# Patient Record
Sex: Male | Born: 1960 | Race: White | Hispanic: No | Smoking: Never smoker
Health system: Southern US, Community
[De-identification: ages and names within clinical notes are randomized; demographics above are authoritative.]

## PROBLEM LIST (undated history)

## (undated) DIAGNOSIS — I2510 Atherosclerotic heart disease of native coronary artery without angina pectoris: Secondary | ICD-10-CM

## (undated) DIAGNOSIS — I809 Phlebitis and thrombophlebitis of unspecified site: Secondary | ICD-10-CM

## (undated) DIAGNOSIS — I2109 ST elevation (STEMI) myocardial infarction involving other coronary artery of anterior wall: Secondary | ICD-10-CM

## (undated) DIAGNOSIS — E7800 Pure hypercholesterolemia, unspecified: Secondary | ICD-10-CM

## (undated) DIAGNOSIS — T7840XA Allergy, unspecified, initial encounter: Secondary | ICD-10-CM

## (undated) DIAGNOSIS — I2699 Other pulmonary embolism without acute cor pulmonale: Secondary | ICD-10-CM

## (undated) HISTORY — DX: ST elevation (STEMI) myocardial infarction involving other coronary artery of anterior wall: I21.09

## (undated) HISTORY — DX: Phlebitis and thrombophlebitis of unspecified site: I80.9

## (undated) HISTORY — DX: Other pulmonary embolism without acute cor pulmonale: I26.99

## (undated) HISTORY — DX: Allergy, unspecified, initial encounter: T78.40XA

## (undated) HISTORY — DX: Atherosclerotic heart disease of native coronary artery without angina pectoris: I25.10

## (undated) HISTORY — DX: Pure hypercholesterolemia, unspecified: E78.00

---

## 2009-12-20 ENCOUNTER — Encounter: Payer: Self-pay | Admitting: Internal Medicine

## 2009-12-20 ENCOUNTER — Inpatient Hospital Stay (HOSPITAL_COMMUNITY): Admission: EM | Admit: 2009-12-20 | Discharge: 2009-12-21 | Payer: Self-pay | Admitting: Emergency Medicine

## 2009-12-20 ENCOUNTER — Encounter: Payer: Self-pay | Admitting: Family Medicine

## 2009-12-21 ENCOUNTER — Encounter (INDEPENDENT_AMBULATORY_CARE_PROVIDER_SITE_OTHER): Payer: Self-pay | Admitting: Internal Medicine

## 2009-12-21 ENCOUNTER — Ambulatory Visit: Payer: Self-pay | Admitting: Vascular Surgery

## 2009-12-21 ENCOUNTER — Ambulatory Visit: Payer: Self-pay | Admitting: Family Medicine

## 2009-12-23 ENCOUNTER — Encounter: Payer: Self-pay | Admitting: Internal Medicine

## 2010-01-09 DIAGNOSIS — Z86718 Personal history of other venous thrombosis and embolism: Secondary | ICD-10-CM

## 2010-01-09 DIAGNOSIS — Z87442 Personal history of urinary calculi: Secondary | ICD-10-CM

## 2010-01-10 ENCOUNTER — Ambulatory Visit: Payer: Self-pay | Admitting: Internal Medicine

## 2010-01-10 ENCOUNTER — Telehealth (INDEPENDENT_AMBULATORY_CARE_PROVIDER_SITE_OTHER): Payer: Self-pay | Admitting: *Deleted

## 2010-01-10 DIAGNOSIS — R042 Hemoptysis: Secondary | ICD-10-CM | POA: Insufficient documentation

## 2010-01-10 DIAGNOSIS — J90 Pleural effusion, not elsewhere classified: Secondary | ICD-10-CM | POA: Insufficient documentation

## 2010-01-10 LAB — CONVERTED CEMR LAB
Basophils Absolute: 0.1 10*3/uL (ref 0.0–0.1)
Eosinophils Absolute: 0.2 10*3/uL (ref 0.0–0.7)
Lymphs Abs: 2.4 10*3/uL (ref 0.7–4.0)
MCHC: 34.7 g/dL (ref 30.0–36.0)
MCV: 91.5 fL (ref 78.0–100.0)
Monocytes Absolute: 0.6 10*3/uL (ref 0.1–1.0)
Neutrophils Relative %: 67.8 % (ref 43.0–77.0)
Platelets: 319 10*3/uL (ref 150.0–400.0)
Prothrombin Time: 23.5 s — ABNORMAL HIGH (ref 9.7–11.8)
RDW: 12.6 % (ref 11.5–14.6)
Sed Rate: 39 mm/hr — ABNORMAL HIGH (ref 0–22)
WBC: 10.1 10*3/uL (ref 4.5–10.5)

## 2010-01-12 ENCOUNTER — Ambulatory Visit: Payer: Self-pay | Admitting: Cardiovascular Disease

## 2010-01-24 ENCOUNTER — Ambulatory Visit: Payer: Self-pay | Admitting: Cardiology

## 2010-02-06 ENCOUNTER — Ambulatory Visit: Payer: Self-pay | Admitting: Cardiovascular Disease

## 2010-02-12 ENCOUNTER — Telehealth (INDEPENDENT_AMBULATORY_CARE_PROVIDER_SITE_OTHER): Payer: Self-pay | Admitting: *Deleted

## 2010-02-27 ENCOUNTER — Ambulatory Visit: Payer: Self-pay | Admitting: Cardiology

## 2010-02-27 LAB — CONVERTED CEMR LAB: POC INR: 1.7

## 2010-03-13 ENCOUNTER — Ambulatory Visit: Payer: Self-pay | Admitting: Cardiology

## 2010-03-13 LAB — CONVERTED CEMR LAB: POC INR: 3.5

## 2010-03-29 ENCOUNTER — Ambulatory Visit: Payer: Self-pay | Admitting: Cardiology

## 2010-03-29 LAB — CONVERTED CEMR LAB: POC INR: 2.8

## 2010-04-03 ENCOUNTER — Ambulatory Visit: Payer: Self-pay | Admitting: Internal Medicine

## 2010-04-24 ENCOUNTER — Ambulatory Visit: Payer: Self-pay | Admitting: Cardiology

## 2010-05-25 ENCOUNTER — Ambulatory Visit: Payer: Self-pay | Admitting: Internal Medicine

## 2010-06-20 ENCOUNTER — Ambulatory Visit: Admission: RE | Admit: 2010-06-20 | Discharge: 2010-06-20 | Payer: Self-pay | Source: Home / Self Care

## 2010-06-20 LAB — CONVERTED CEMR LAB: POC INR: 1.8

## 2010-07-11 ENCOUNTER — Ambulatory Visit: Admission: RE | Admit: 2010-07-11 | Discharge: 2010-07-11 | Payer: Self-pay | Source: Home / Self Care

## 2010-07-17 NOTE — Medication Information (Signed)
Summary: rov/tp  Anticoagulant Therapy  Managed by: Geoffry Paradise, PharmD Referring MD: Sherene Sires MD, Casimiro Needle PCP: Dr. Emmit Alexanders MD: Tenny Craw MD, Gunnar Fusi Indication 1: Pulmonary Embolism Lab Used: [facility] [place] Site: [address] INR RANGE 2.0-3.0  Dietary changes: no    Health status changes: no    Bleeding/hemorrhagic complications: no    Recent/future hospitalizations: no    Any changes in medication regimen? no    Recent/future dental: no  Any missed doses?: no       Is patient compliant with meds? yes       Allergies: 1)  ! Pcn  Anticoagulation Management History:      Negative risk factors for bleeding include an age less than 49 years old.  The bleeding index is 'low risk'.  Negative CHADS2 values include Age > 49 years old.  His last INR was 2.2 ratio.  Anticoagulation responsible provider: Tenny Craw MD, Gunnar Fusi.  Cuvette Lot#: [id number].  Exp: 05/17/2012.    Anticoagulation Management Assessment/Plan:      The patient's current anticoagulation dose is Warfarin sodium 5 mg tabs: Take as directed by coumadin clinic..  The next INR is due 06/22/2010.  Anticoagulation instructions were given to patient.  Results were reviewed/authorized by Geoffry Paradise, PharmD.         Prior Anticoagulation Instructions: INR 2.3  Continue same dose of 1/2 tablet every day except 1 tablet on Wednesday and Saturday.  Recheck INR in 4 weeks.   Current Anticoagulation Instructions: INR: 2.2  Your INR is at goal today.  Please continue taking 5 mg (1 tablet) on Wednesdays and Saturdays, and 2.5 mg (1/2 tablet) other days othe week.  Please return in 4 weeks for another INR check.

## 2010-07-17 NOTE — Medication Information (Signed)
Summary: Andrew Gilbert  Anticoagulant Therapy  Managed by: Weston Brass, PharmD Referring MD: Sherene Sires MD, Casimiro Needle PCP: Dr. Emmit Alexanders MD: Myrtis Ser MD, Tinnie Gens Indication 1: Pulmonary Embolism Lab Used: [facility]  Site: [address] INR POC 3.5 INR RANGE 2.0-3.0  Dietary changes: yes       Details: ate less greens  Health status changes: no    Bleeding/hemorrhagic complications: no    Recent/future hospitalizations: no    Any changes in medication regimen? no    Recent/future dental: no  Any missed doses?: no       Is patient compliant with meds? yes       Allergies: 1)  ! Pcn  Anticoagulation Management History:      The patient is taking warfarin and comes in today for a routine follow up visit.  Negative risk factors for bleeding include an age less than 37 years old.  The bleeding index is 'low risk'.  Negative CHADS2 values include Age > 38 years old.  His last INR was 2.2 ratio.  Anticoagulation responsible provider: Myrtis Ser MD, Tinnie Gens.  INR POC: 3.5.  Cuvette Lot#: [id number].  Exp: 04/2011.    Anticoagulation Management Assessment/Plan:      The patient's current anticoagulation dose is Warfarin sodium 5 mg tabs: Take as directed by coumadin clinic..  The next INR is due 03/27/2010.  Anticoagulation instructions were given to patient.  Results were reviewed/authorized by Weston Brass, PharmD.  He was notified by Kennieth Francois.         Prior Anticoagulation Instructions: INR 1.7  Take 1 tablet today. Then take 1/2 tablet everyday except take 1 tablet on Mondays, Wednesdays, and Saturdays. Re-check INR in 2 weeks.   Current Anticoagulation Instructions: INR 3.5  Hold today's dose of Coumadin, then resume one-half tablet every day except for one tablet on Wednesday and Saturday.  Recheck in two weeks.

## 2010-07-17 NOTE — Medication Information (Signed)
Summary: rov/jk  Anticoagulant Therapy  Managed by: Weston Brass, PharmD Referring MD: Sherene Sires MD, Casimiro Needle PCP: Dr. Emmit Alexanders MD: Clifton James MD, Cristal Deer Indication 1: Pulmonary Embolism Lab Used: [facility] [place] Site: [address] INR POC 2.0 INR RANGE 2.0-3.0  Dietary changes: no    Health status changes: no    Bleeding/hemorrhagic complications: no    Recent/future hospitalizations: no    Any changes in medication regimen? no    Recent/future dental: no  Any missed doses?: no       Is patient compliant with meds? yes       Allergies: 1)  ! Pcn  Anticoagulation Management History:      The patient is taking warfarin and comes in today for a routine follow up visit.  Negative risk factors for bleeding include an age less than 52 years old.  The bleeding index is 'low risk'.  Negative CHADS2 values include Age > 72 years old.  His last INR was 2.2 ratio.  Anticoagulation responsible provider: Clifton James MD, Cristal Deer.  INR POC: 2.0.  Cuvette Lot#: [id number].  Exp: 03/2011.    Anticoagulation Management Assessment/Plan:      The patient's current anticoagulation dose is Warfarin sodium 5 mg tabs: 1 every other day.  The next INR is due 02/27/2010.  Anticoagulation instructions were given to patient.  Results were reviewed/authorized by Weston Brass, PharmD.  He was notified by Gweneth Fritter, PharmD Candidate.         Prior Anticoagulation Instructions: INR 1.9  Take 1 whole tablet (5mg ) tonight.  Then resume taking 1/2 tablet (2.5mg ) every day except take 1 tablet (5mg ) on Wednesdays.   Current Anticoagulation Instructions: INR 2.0  Continue taking 1/2 tablet (2.5mg ) every day except take 1 tablet (5mg ) on Wednesdays and Saturdays.  Recheck in

## 2010-07-17 NOTE — Medication Information (Signed)
Summary: rov/sp  Anticoagulant Therapy  Managed by: Reina Fuse, PharmD Referring MD: Sherene Sires MD, Casimiro Needle PCP: Dr. Emmit Alexanders MD: Jens Som MD, Arlys John Indication 1: Pulmonary Embolism Lab Used: [facility]  Site: [address] INR POC 2.8 INR RANGE 2.0-3.0  Dietary changes: no    Health status changes: no    Bleeding/hemorrhagic complications: no    Recent/future hospitalizations: no    Any changes in medication regimen? yes       Details: Zpak last week for sinus infection. Currently on Avelox 400 mg daily (5 days left).  Recent/future dental: no  Any missed doses?: no       Is patient compliant with meds? yes       Current Medications (verified): 1)  Warfarin Sodium 5 Mg Tabs (Warfarin Sodium) .... Take As Directed By Coumadin Clinic. 2)  Tylenol 325 Mg Tabs (Acetaminophen) .... As Needed  Allergies (verified): 1)  ! Pcn  Anticoagulation Management History:      The patient is taking warfarin and comes in today for a routine follow up visit.  Negative risk factors for bleeding include an age less than 40 years old.  The bleeding index is 'low risk'.  Negative CHADS2 values include Age > 61 years old.  His last INR was 2.2 ratio.  Anticoagulation responsible Glenroy Crossen: Jens Som MD, Arlys John.  INR POC: 2.8.  Cuvette Lot#: [id number].  Exp: 04/2011.    Anticoagulation Management Assessment/Plan:      The patient's current anticoagulation dose is Warfarin sodium 5 mg tabs: Take as directed by coumadin clinic..  The next INR is due 04/24/2010.  Anticoagulation instructions were given to patient.  Results were reviewed/authorized by Reina Fuse, PharmD.  He was notified by Reina Fuse PharmD.         Prior Anticoagulation Instructions: INR 3.5  Hold today's dose of Coumadin, then resume one-half tablet every day except for one tablet on Wednesday and Saturday.  Recheck in two weeks.  Current Anticoagulation Instructions: INR 2.8  Continue taking  Coumadin 2.5 mg (0.5 tab) on all days except Coumadin 5 mg (1 tab) on Wednesdays and Saturdays.  Return to clinic in 3 weeks.

## 2010-07-17 NOTE — Progress Notes (Signed)
Summary: Need samples Warfarin  Phone Note Call from Patient Call back at Home Phone [phone]   Caller: Patient Summary of Call: Pt want samples of Warfarin Initial call taken by: Judie Grieve,  February 12, 2010 9:39 AM  Follow-up for Phone Call        Called spoke with pt has been receiving samples of the 5mg  tablet, original rx was for 10mg  tablets.  Advised pt we will send in rx for Coumadin 5mg  tablet [facility].   Follow-up by: Cloyde Reams RN,  February 12, 2010 10:00 AM    New/Updated Medications: WARFARIN SODIUM 5 MG TABS (WARFARIN SODIUM) Take as directed by coumadin clinic. Prescriptions: WARFARIN SODIUM 5 MG TABS (WARFARIN SODIUM) Take as directed by coumadin clinic.  #30 x 2   Entered by:   Cloyde Reams RN   Authorized by:   Nyoka Cowden MD   Signed by:   Cloyde Reams RN on 02/12/2010   Method used:   [facility]        [facility]  [place] [address]       [address]       [place], [place]  [address]       Ph: [phone] or [phone]       Fax: [phone]   RxID:   [id number]

## 2010-07-17 NOTE — Assessment & Plan Note (Signed)
Summary: Pulmonary consultation/ L dvt/Pe with residual left effusion   Visit Type:  Initial Consult Copy to:  Dr. Milus Glazier Primary Provider/Referring Provider:  Dr. Milus Glazier  CC:  Recent PE.  History of Present Illness: 6 yowm never smoker freq traveler to [place] on Business with new L calf pain   early July dx with dvt and PE [facility]  after presented with pleuritic cp/ hemoptysis, discharged on lovenox bridge to coumadin with residual atx/ effusion L base.   January 10, 2010  1st pulmonary office eval for persistent sob with mild L pleuritic cp with deep breathing but represents marked improvement and hemoptysis has subsided and left leg is nl with last inr 7/12/1.  Pt denies any significant sore throat, dysphagia, itching, sneezing,  nasal congestion or excess secretions,  fever, chills, sweats, unintended wt loss, pleuritic or exertional cp,   change in activity tolerance  orthopnea pnd or leg swelling. .No h/o leg trauma and fm hx neg dvt  Current Medications (verified): 1)  Warfarin Sodium 5 Mg Tabs (Warfarin Sodium) .Marland Kitchen.. 1 Every Other Day 2)  Tylenol 325 Mg Tabs (Acetaminophen) .... As Needed  Allergies (verified): 1)  ! 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  The patient denies shortness of breath at rest, non-productive cough, chest pain, irregular heartbeats, acid heartburn, indigestion, loss of appetite, weight change, abdominal pain, difficulty swallowing, sore throat, tooth/dental problems, headaches, nasal congestion/difficulty breathing through nose, sneezing, itching, ear ache, depression,  hand/feet swelling, joint stiffness or pain, rash, change in color of mucus, fever, and anxiety.    Vital Signs:  Patient profile:   50 year old male Height:      70 inches Weight:      203 pounds BMI:     29.23 O2 Sat:      97 % on Room air Temp:     98.8 degrees F oral Pulse rate:   88 / minute BP sitting:   122 / 86  (left arm)  Vitals Entered By: Vernie Murders (January 10, 2010 8:57 AM)  O2 Flow:  Room air  Physical Exam  Additional Exam:  amb wm nad wt 203 January 10, 2010 HEENT: nl dentition, turbinates, and orophanx. Nl external ear canals without cough reflex NECK :  without JVD/Nodes/TM/ nl carotid upstrokes bilaterally LUNGS: no acc muscle use, clear to A and P bilaterally without cough on insp or exp maneuvers CV:  RRR  no s3 or murmur or increase in P2, no edema  ABD:  soft and nontender with nl excursion in the supine position. No bruits or organomegaly, bowel sounds nl MS:  warm without deformities, calf tenderness, cyanosis or clubbing SKIN: warm and dry without lesions  x minimal vericose vein changes L calf NEURO:  alert, approp, no deficits     White Cell Count          10.1 K/uL                   4.5-10.5   Red Cell Count            4.53  Mil/uL                 4.22-5.81   Hemoglobin                14.4 g/dL                   40.9-81.1   Hematocrit                41.5 %                      39.0-52.0   MCV                       91.5 fl                     78.0-100.0   MCHC                      34.7 g/dL                   91.4-78.2   RDW                       12.6 %                      11.5-14.6   Platelet Count            319.0 K/uL                  150.0-400.0   Neutrophil %              67.8 %                      43.0-77.0   Lymphocyte %              23.5 %                      12.0-46.0   Monocyte %                5.9 %                       3.0-12.0   Eosinophils%              2.1 %                       0.0-5.0   Basophils %               0.7 %                        0.0-3.0   Neutrophill Absolute      6.8 K/uL                    1.4-7.7   Lymphocyte Absolute       2.4 K/uL                    0.7-4.0   Monocyte Absolute         0.6 K/uL                    0.1-1.0  Eosinophils, Absolute  0.2 K/uL                    0.0-0.7   Basophils Absolute        0.1 K/uL                    0.0-0.1  Tests: (2) Sed Rate (ESR)   Sed Rate             [H]  39 mm/hr                    0-22  Tests: (3) PT (PTP)   Prothrombin Time     [H]  23.5 sec                    9.7-11.8   INR                  [H]  2.2 ratio                   0.8-1.0  CXR  Procedure date:  01/10/2010  Findings:      Comparison: CT chest of 12/20/2009   Findings: There is persistent opacity within the left lower lobe with some volume loss at the site of prior pulmonary emboli and probable infarction.  Otherwise the lungs are clear.  Heart size is stable.  No bony abnormality is seen   IMPRESSION: Persistent opacity in the left lower lobe at the site of previously demonstrated pulmonary emboli and probable infarction with atelectasis.  Impression & Recommendations:  Problem # 1:  Hx of PULMONARY EMBOLISM, HX OF (ICD-V12.51)  His updated medication list for this problem includes:    Warfarin Sodium 5 Mg Tabs (Warfarin sodium) .Marland Kitchen... 1 every other day  Needs 6 months rx then repeat L venous doppler before stopping coumadin  and hypercoagulability profile off coumadin  Orders: [address] [facility] Referral (Coumadin clinic) Consultation Level V [phone])  Problem # 2:  HEMOPTYSIS (ICD-786.3) Resolving nicely  Orders: TLB-CBC Platelet - w/Differential (85025-CBCD) TLB-Sedimentation Rate (ESR) (85652-ESR) TLB-PT (Protime) (85610-PTP) Consultation Level V (03500)  Problem # 3:  PLEURAL EFFUSION (ICD-511.9) secondary to infarct so should resolve without further rx, no need to intervene unless symptoms worsen  Medications Added to Medication  List This Visit: 1)  Warfarin Sodium 5 Mg Tabs (Warfarin sodium) .Marland Kitchen.. 1 every other day 2)  Tylenol 325 Mg Tabs (Acetaminophen) .... As needed  Other Orders: T-2 View CXR (71020TC)  Patient Instructions: 1)  We will call you the lab result and tell you when to go to coumadin clinic located at [facility] on [address] 2)  You will having scarring in the left side of chest 3)  Return to office in 3 months, sooner if needed

## 2010-07-17 NOTE — Progress Notes (Signed)
Summary: ? about cxr  Phone Note Call from Patient Call back at Home Phone [phone]   Caller: Patient Call For: wert Summary of Call: pt was seen today. says dr wert told him that cxr today showed "moderate improvement". pt wants to know if this is relative to fluid in lungs and or blood clots.  Initial call taken by: Tivis Ringer, CNA,  January 10, 2010 3:16 PM  Follow-up for Phone Call        Please advise, thanks the effusion looks better, we can't see the clots but know they're getting based on how he's doing, no change in recs Follow-up by: Nyoka Cowden MD,  January 10, 2010 3:40 PM  Additional Follow-up for Phone Call Additional follow up Details #1::        Spoke with pt and notified of the above recs per MW. Additional Follow-up by: Vernie Murders,  January 10, 2010 3:46 PM

## 2010-07-17 NOTE — Medication Information (Signed)
Summary: rov/tm  Anticoagulant Therapy  Managed by: Weston Brass, PharmD Referring MD: Sherene Sires MD, Casimiro Needle PCP: Dr. Emmit Alexanders MD: Shirlee Latch MD, Freida Busman Indication 1: Pulmonary Embolism Lab Used: [facility] [place] Site: [address] INR POC 1.9 INR RANGE 2.0-3.0  Dietary changes: no    Health status changes: no    Bleeding/hemorrhagic complications: no    Recent/future hospitalizations: no    Any changes in medication regimen? no    Recent/future dental: no  Any missed doses?: no       Is patient compliant with meds? yes       Allergies: 1)  ! Pcn  Anticoagulation Management History:      The patient is taking warfarin and comes in today for a routine follow up visit.  Negative risk factors for bleeding include an age less than 28 years old.  The bleeding index is 'low risk'.  Negative CHADS2 values include Age > 67 years old.  His last INR was 2.2 ratio.  Anticoagulation responsible provider: Shirlee Latch MD, Timithy Arons.  INR POC: 1.9.  Cuvette Lot#: [id number].  Exp: 03/2011.    Anticoagulation Management Assessment/Plan:      The patient's current anticoagulation dose is Warfarin sodium 5 mg tabs: 1 every other day.  The next INR is due 02/02/2010.  Anticoagulation instructions were given to patient.  Results were reviewed/authorized by Weston Brass, PharmD.  He was notified by Gweneth Fritter, PharmD Candidate.         Prior Anticoagulation Instructions: INR 2.4 Change dose to 2.5mg s everyday.   Sample 5mg s #10 Lot # [id number] Exp 06/2011  Current Anticoagulation Instructions: INR 1.9  Take 1 whole tablet (5mg ) tonight.  Then resume taking 1/2 tablet (2.5mg ) every day except take 1 tablet (5mg ) on Wednesdays.

## 2010-07-17 NOTE — Medication Information (Signed)
Summary: ccn/inr went from 1.9-to 2.2-mb  Anticoagulant Therapy  Managed by: Bethena Midget, RN, BSN Referring MD: Sherene Sires MD, Casimiro Needle PCP: Dr. Emmit Alexanders MD: Eden Emms MD, Theron Arista Indication 1: Pulmonary Embolism Lab Used: [facility] [place] Site: [address] INR POC 2.4 INR RANGE 2.0-3.0  Dietary changes: no    Health status changes: no    Bleeding/hemorrhagic complications: no    Recent/future hospitalizations: no    Any changes in medication regimen? no    Recent/future dental: no  Any missed doses?: no       Is patient compliant with meds? yes      Comments: New pt previously on coumadin started 12/22/09 and followed a [facility]. New pt education completed.   Current Medications (verified): 1)  Warfarin Sodium 5 Mg Tabs (Warfarin Sodium) .Marland Kitchen.. 1 Every Other Day 2)  Tylenol 325 Mg Tabs (Acetaminophen) .... As Needed  Allergies (verified): 1)  ! Pcn  Anticoagulation Management History:      The patient comes in today for his initial visit for anticoagulation therapy.  Negative risk factors for bleeding include an age less than 49 years old.  The bleeding index is 'low risk'.  Negative CHADS2 values include Age > 49 years old.  His last INR was 2.2 ratio.  Anticoagulation responsible provider: Eden Emms MD, Theron Arista.  INR POC: 2.4.  Cuvette Lot#: [id number].  Exp: 03/2011.    Anticoagulation Management Assessment/Plan:      The patient's current anticoagulation dose is Warfarin sodium 5 mg tabs: 1 every other day.  The next INR is due 01/24/2010.  Anticoagulation instructions were given to patient.  Results were reviewed/authorized by Bethena Midget, RN, BSN.  He was notified by Bethena Midget, RN, BSN.         Current Anticoagulation Instructions: INR 2.4 Change dose to 2.5mg s everyday.   Sample 5mg s #10 Lot # [id number] Exp 06/2011

## 2010-07-17 NOTE — Medication Information (Signed)
Summary: rov/sl  Anticoagulant Therapy  Managed by: Weston Brass, PharmD Referring MD: Sherene Sires MD, Casimiro Needle PCP: Dr. Emmit Alexanders MD: Myrtis Ser MD, Tinnie Gens Indication 1: Pulmonary Embolism Lab Used: [facility] [place] Site: [address] INR POC 2.3 INR RANGE 2.0-3.0  Dietary changes: no    Health status changes: no    Bleeding/hemorrhagic complications: no    Recent/future hospitalizations: no    Any changes in medication regimen? no    Recent/future dental: no  Any missed doses?: no       Is patient compliant with meds? yes       Allergies: 1)  ! Pcn  Anticoagulation Management History:      The patient is taking warfarin and comes in today for a routine follow up visit.  Negative risk factors for bleeding include an age less than 63 years old.  The bleeding index is 'low risk'.  Negative CHADS2 values include Age > 84 years old.  His last INR was 2.2 ratio.  Anticoagulation responsible Jnae Thomaston: Myrtis Ser MD, Tinnie Gens.  INR POC: 2.3.  Cuvette Lot#: [id number].  Exp: 05/2011.    Anticoagulation Management Assessment/Plan:      The patient's current anticoagulation dose is Warfarin sodium 5 mg tabs: Take as directed by coumadin clinic..  The next INR is due 05/25/2010.  Anticoagulation instructions were given to patient.  Results were reviewed/authorized by Weston Brass, PharmD.  He was notified by Weston Brass PharmD.         Prior Anticoagulation Instructions: INR 2.8  Continue taking Coumadin 2.5 mg (0.5 tab) on all days except Coumadin 5 mg (1 tab) on Wednesdays and Saturdays.  Return to clinic in 3 weeks.   Current Anticoagulation Instructions: INR 2.3  Continue same dose of 1/2 tablet every day except 1 tablet on Wednesday and Saturday.  Recheck INR in 4 weeks.  Prescriptions: WARFARIN SODIUM 5 MG TABS (WARFARIN SODIUM) Take as directed by coumadin clinic.  #30 x 2   Entered by:   Weston Brass PharmD   Authorized by:   Talitha Givens, MD, [facility]  Signed by:   Weston Brass PharmD on 04/24/2010   Method used:   [facility]        [facility]  [place] [address] [facility]       [address]       [place], [place]  [address]       Ph: [phone] or [phone]       Fax: [phone]   RxID:   [id number]

## 2010-07-17 NOTE — Medication Information (Signed)
Summary: rov/jk  Anticoagulant Therapy  Managed by: Weston Brass, PharmD Referring MD: Sherene Sires MD, Casimiro Needle PCP: Dr. Emmit Alexanders MD: Shirlee Latch MD, Freida Busman Indication 1: Pulmonary Embolism Lab Used: [facility] [place] Site: [address] INR POC 1.7 INR RANGE 2.0-3.0  Dietary changes: no    Health status changes: no    Bleeding/hemorrhagic complications: no    Recent/future hospitalizations: no    Any changes in medication regimen? no    Recent/future dental: no  Any missed doses?: no       Is patient compliant with meds? yes       Allergies: 1)  ! Pcn  Anticoagulation Management History:      The patient is taking warfarin and comes in today for a routine follow up visit.  Negative risk factors for bleeding include an age less than 63 years old.  The bleeding index is 'low risk'.  Negative CHADS2 values include Age > 54 years old.  His last INR was 2.2 ratio.  Anticoagulation responsible Deshia Vanderhoof: Shirlee Latch MD, [place].  INR POC: 1.7.  Cuvette Lot#: [id number].  Exp: 04/2011.    Anticoagulation Management Assessment/Plan:      The patient's current anticoagulation dose is Warfarin sodium 5 mg tabs: Take as directed by coumadin clinic..  The next INR is due 03/13/2010.  Anticoagulation instructions were given to patient.  Results were reviewed/authorized by Weston Brass, PharmD.  He was notified by Harrel Carina, PharmD candidate.         Prior Anticoagulation Instructions: INR 2.0  Continue taking 1/2 tablet (2.5mg ) every day except take 1 tablet (5mg ) on Wednesdays and Saturdays.  Recheck in   Current Anticoagulation Instructions: INR 1.7  Take 1 tablet today. Then take 1/2 tablet everyday except take 1 tablet on Mondays, Wednesdays, and Saturdays. Re-check INR in 2 weeks.

## 2010-07-17 NOTE — Assessment & Plan Note (Signed)
Summary: Pulmonary/ ov f/u effusion/ pulmonary embolism   Copy to:  Dr. Milus Glazier Primary Provider/Referring Provider:  Dr. Milus Glazier  CC:  CP and hemoptysis- resolved.  History of Present Illness: 50 yowm never smoker freq traveler to [place] on Business with new L calf pain   early July 2011  dx with dvt and PE [facility]  after presented with pleuritic cp/ hemoptysis, discharged on lovenox bridge to coumadin with residual atx/ effusion L base.   January 10, 2010  1st pulmonary office eval for persistent sob with mild L pleuritic cp with deep breathing but represents marked improvement and hemoptysis has subsided and left leg is nl with last inr 7/12 rec no change rx  April 03, 2010 ov CP and hemoptysis- resolved.  walking regularly without sob. no leg swelling. Pt denies any significant sore throat, dysphagia, itching, sneezing,  nasal congestion or excess secretions,  fever, chills, sweats, unintended wt loss, pleuritic or exertional cp, hempoptysis, change in activity tolerance  orthopnea pnd .  Current Medications (verified): 1)  Warfarin Sodium 5 Mg Tabs (Warfarin Sodium) .... Take As Directed By Coumadin Clinic. 2)  Tylenol 325 Mg Tabs (Acetaminophen) .... As Needed  Allergies (verified): 1)  ! Pcn  Past History:  Past Medical History: Kidney stones  L DVT and PE  12/20/2009   - CT Chest 12/20/09 bilateral PE and LLL infarction > cxr cleared April 03, 2010   Vital Signs:  Patient profile:   50 year old male Weight:      203.25 pounds O2 Sat:      98 % on Room air Temp:     98.1 degrees F oral Pulse rate:   97 / minute BP sitting:   138 / 84  (left arm)  Vitals Entered By: Vernie Murders (April 03, 2010 3:51 PM)  O2 Flow:  Room air  Physical Exam  Additional Exam:  amb wm nad wt 203 January 10, 2010 > 203 April 03, 2010  HEENT: nl dentition, turbinates, and orophanx. Nl external ear canals without cough reflex NECK :  without JVD/Nodes/TM/ nl carotid upstrokes  bilaterally LUNGS: no acc muscle use, clear to A and P bilaterally without cough on insp or exp maneuvers CV:  RRR  no s3 or murmur or increase in P2, no edema  ABD:  soft and nontender with nl excursion in the supine position. No bruits or organomegaly, bowel sounds nl EXT  warm without deformities, calf tenderness, cyanosis or clubbing SKIN: warm and dry without lesions  x minimal vericose vein changes L calf, no tenerness.       CXR  Procedure date:  04/03/2010  Findings:      Interval clearing of the opacity within the left base.  Mild chronic bronchitic change.  Impression & Recommendations:  Problem # 1:  PLEURAL EFFUSION (ICD-511.9) Resolved likely secondary to infarction which has headed completely on coumadin  Problem # 2:  HEMOPTYSIS (ICD-786.3)  Resolved  Problem # 3:  Hx of PULMONARY EMBOLISM, HX OF (ICD-V12.51) I had an extended discussion with the patient today lasting 15 to 20 minutes of a 25 minute visit on the following issues:   Discussed longterm rx > plan to do venous dopplers in January then consider d/c coumadin     Other Orders: T-2 View CXR (71020TC) Est. Patient Level IV (16109)  Patient Instructions: 1)  Stay on coumdin until aftr the first of year 2)  afer the first of the year call Almyra Free [phone] to schedule venous  doppler and then we'll regroup with you after that re stopping coumdin and any further work up you may need to assess the risk of future clots

## 2010-07-19 NOTE — Medication Information (Signed)
Summary: rov/sp  Anticoagulant Therapy  Managed by: Weston Brass, PharmD Referring MD: Sherene Sires MD, Casimiro Needle PCP: Dr. Emmit Alexanders MD: Patty Sermons Indication 1: Pulmonary Embolism Lab Used: [facility] [place] Site: [address] INR POC 1.6 INR RANGE 2.0-3.0  Dietary changes: no    Health status changes: no    Bleeding/hemorrhagic complications: no    Recent/future hospitalizations: no    Any changes in medication regimen? yes       Details: Finished Avelox course 2 weeks ago   Recent/future dental: no  Any missed doses?: no       Is patient compliant with meds? yes       Allergies: 1)  ! Pcn  Anticoagulation Management History:      The patient is taking warfarin and comes in today for a routine follow up visit.  Negative risk factors for bleeding include an age less than 68 years old.  The bleeding index is 'low risk'.  Negative CHADS2 values include Age > 62 years old.  His last INR was 2.2 ratio.  Anticoagulation responsible provider: Nalaya Wojdyla.  INR POC: 1.6.  Cuvette Lot#: [id number].  Exp: 06/2011.    Anticoagulation Management Assessment/Plan:      The patient's current anticoagulation dose is Warfarin sodium 5 mg tabs: Take as directed by coumadin clinic..  The next INR is due 07/26/2010.  Anticoagulation instructions were given to patient.  Results were reviewed/authorized by Weston Brass, PharmD.  He was notified by Stephannie Peters, PharmD Candidate .         Prior Anticoagulation Instructions: INR 1.8  Take 1 1/2 tablets today then resume same dose of 1/2 tablet every day except 1 tablet on Wednesday and Saturday.  Recheck INR in 3 weeks.   Current Anticoagulation Instructions: INR 1.6  Coumadin 5 mg tablets - Take 1.5 tablets today, then increase dose to 1/2 tablet every day except 1 tablet on Mondays, Wednesdays and Saturdays

## 2010-07-19 NOTE — Medication Information (Signed)
Summary: Started on Avelox on 06/14/10/ewj  Anticoagulant Therapy  Managed by: Weston Brass, PharmD Referring MD: Sherene Sires MD, Casimiro Needle PCP: Dr. Emmit Alexanders MD: Patty Sermons Indication 1: Pulmonary Embolism Lab Used: [facility] [place] Site: [address] INR POC 1.8 INR RANGE 2.0-3.0  Dietary changes: no    Health status changes: no    Bleeding/hemorrhagic complications: no    Recent/future hospitalizations: no    Any changes in medication regimen? yes       Details: Day 7/10 of Avelox  Recent/future dental: no  Any missed doses?: no       Is patient compliant with meds? yes       Allergies: 1)  ! Pcn  Anticoagulation Management History:      The patient is taking warfarin and comes in today for a routine follow up visit.  Negative risk factors for bleeding include an age less than 16 years old.  The bleeding index is 'low risk'.  Negative CHADS2 values include Age > 34 years old.  His last INR was 2.2 ratio.  Anticoagulation responsible provider: Toye Rouillard.  INR POC: 1.8.  Cuvette Lot#: [id number].  Exp: 07/2011.    Anticoagulation Management Assessment/Plan:      The patient's current anticoagulation dose is Warfarin sodium 5 mg tabs: Take as directed by coumadin clinic..  The next INR is due 07/11/2010.  Anticoagulation instructions were given to patient.  Results were reviewed/authorized by Weston Brass, PharmD.  He was notified by Weston Brass PharmD.         Prior Anticoagulation Instructions: INR: 2.2  Your INR is at goal today.  Please continue taking 5 mg (1 tablet) on Wednesdays and Saturdays, and 2.5 mg (1/2 tablet) other days othe week.  Please return in 4 weeks for another INR check.      Current Anticoagulation Instructions: INR 1.8  Take 1 1/2 tablets today then resume same dose of 1/2 tablet every day except 1 tablet on Wednesday and Saturday.  Recheck INR in 3 weeks.

## 2010-07-27 ENCOUNTER — Ambulatory Visit (INDEPENDENT_AMBULATORY_CARE_PROVIDER_SITE_OTHER): Payer: 59 | Admitting: Internal Medicine

## 2010-07-27 ENCOUNTER — Encounter: Payer: Self-pay | Admitting: Internal Medicine

## 2010-07-27 DIAGNOSIS — Z86718 Personal history of other venous thrombosis and embolism: Secondary | ICD-10-CM

## 2010-07-27 DIAGNOSIS — I82409 Acute embolism and thrombosis of unspecified deep veins of unspecified lower extremity: Secondary | ICD-10-CM

## 2010-07-31 ENCOUNTER — Encounter (INDEPENDENT_AMBULATORY_CARE_PROVIDER_SITE_OTHER): Payer: 59

## 2010-07-31 ENCOUNTER — Encounter: Payer: Self-pay | Admitting: Cardiovascular Disease

## 2010-07-31 DIAGNOSIS — Z7901 Long term (current) use of anticoagulants: Secondary | ICD-10-CM

## 2010-07-31 DIAGNOSIS — I8010 Phlebitis and thrombophlebitis of unspecified femoral vein: Secondary | ICD-10-CM

## 2010-07-31 LAB — CONVERTED CEMR LAB: POC INR: 2.5

## 2010-08-02 NOTE — Assessment & Plan Note (Addendum)
Summary: Pulmonary/ ov for 6 months f/u studies ? stop coumadin   Copy to:  Dr. Milus Glazier Primary Provider/Referring Provider:  Dr. Milus Glazier  CC:  Hx of PE.  History of Present Illness: 50 yowm never smoker freq traveler to [place] on Business with new L calf pain   early July 2011  dx with dvt and PE [facility]  after presented with pleuritic cp/ hemoptysis, discharged on lovenox bridge to coumadin with residual atx/ effusion L base.   January 10, 2010  1st pulmonary office eval for persistent sob with mild L pleuritic cp with deep breathing but represents marked improvement and hemoptysis has subsided and left leg is nl with last inr 7/12 ok rec no change rx  April 03, 2010 ov CP and hemoptysis- resolved.  walking regularly without sob. no leg swelling. cxr now completely clear, not exercising.  rec no change rx for minimum of 6 months  July 27, 2010 ov now @ 6 months feeling fine but still not aerobically active. Pt denies any significant sore throat, dysphagia, itching, sneezing,  nasal congestion or excess secretions,  fever, chills, sweats, unintended wt loss, pleuritic or exertional cp, hempoptysis, change in activity tolerance  orthopnea pnd or leg swelling.   Current Medications (verified): 1)  Warfarin Sodium 5 Mg Tabs (Warfarin Sodium) .... Take As Directed By Coumadin Clinic. 2)  Tylenol 325 Mg Tabs (Acetaminophen) .... As Needed  Allergies (verified): 1)  ! Pcn  Past History:  Past Medical History: Kidney stones  L DVT and PE  12/20/2009   - CT Chest 12/20/09 bilateral PE and LLL infarction > cxr cleared April 03, 2010    - Repeat venous doppler  ordered July 27, 2010 >>>   - 2d Echo ordered July 27, 2010>>   Vital Signs:  Patient profile:   50 year old male Weight:      202 pounds O2 Sat:      97 % on Room air Temp:     97.9 degrees F oral Pulse rate:   79 / minute BP sitting:   120 / 76  (left arm)  Vitals Entered By: Vernie Murders (July 27, 2010  2:32 PM)  O2 Flow:  Room air  Physical Exam  Additional Exam:  amb wm nad  wt 203 January 10, 2010 > 203 April 03, 2010  202 July 28, 2010  HEENT: nl dentition, turbinates, and orophanx. Nl external ear canals without cough reflex NECK :  without JVD/Nodes/TM/ nl carotid upstrokes bilaterally LUNGS: no acc muscle use, clear to A and P bilaterally without cough on insp or exp maneuvers CV:  RRR  no s3 or murmur or increase in P2, no edema  ABD:  soft and nontender with nl excursion in the supine position. No bruits or organomegaly, bowel sounds nl EXT  warm without deformities, calf tenderness, cyanosis or clubbing SKIN: warm and dry without lesions  x minimal vericose vein changes L calf, no tenerness.       Impression & Recommendations:  Problem # 1:  DEEP VENOUS THROMBOPHLEBITIS (ICD-453.40) Repeat doppler due, if neg ok to stop coumadin unless evidence of PAH. discussed strategies to prevent recurrence including wt loss, aerobics, asa 325 mg daily  Problem # 2:  Hx of PULMONARY EMBOLISM, HX OF (ICD-V12.51)  His updated medication list for this problem includes:    Warfarin Sodium 5 Mg Tabs (Warfarin sodium) .Marland Kitchen... Take as directed by coumadin clinic.  Needs Echo to risk stratify for longterm anticoagulation need.  Orders: Est. Patient Level III (16109)  Other Orders: Echo Referral (Echo) Misc. Referral (Misc. Ref)  Patient Instructions: 1)  See Patient Care Coordinator before leaving for repeat venous dopplers Left leg and echocardiogram   Appended Document: Pulmonary/ ov for 6 months f/u studies ? stop coumadin copy to Dr Milus Glazier

## 2010-08-08 NOTE — Medication Information (Signed)
Summary: Coumadin Clinic  Anticoagulant Therapy  Managed by: Windell Hummingbird, RN Referring MD: Sherene Sires MD, Casimiro Needle PCP: Dr. Emmit Alexanders MD: Eden Emms MD, Theron Arista Indication 1: Pulmonary Embolism Lab Used: [facility] [place] Site: [address] INR POC 2.5 INR RANGE 2.0-3.0  Dietary changes: no    Health status changes: no    Bleeding/hemorrhagic complications: no    Recent/future hospitalizations: no    Any changes in medication regimen? no    Recent/future dental: no  Any missed doses?: no       Is patient compliant with meds? yes       Allergies: 1)  ! Pcn  Anticoagulation Management History:      The patient is taking warfarin and comes in today for a routine follow up visit.  Negative risk factors for bleeding include an age less than 21 years old.  The bleeding index is 'low risk'.  Negative CHADS2 values include Age > 34 years old.  His last INR was 2.2 ratio.  Anticoagulation responsible provider: [place] Emms MD, Theron Arista.  INR POC: 2.5.  Cuvette Lot#: [id number].  Exp: 06/2011.    Anticoagulation Management Assessment/Plan:      The patient's current anticoagulation dose is Warfarin sodium 5 mg tabs: Take as directed by coumadin clinic..  The next INR is due 08/28/2010.  Anticoagulation instructions were given to patient.  Results were reviewed/authorized by Windell Hummingbird, RN.  He was notified by Windell Hummingbird, RN.         Prior Anticoagulation Instructions: INR 1.6  Coumadin 5 mg tablets - Take 1.5 tablets today, then increase dose to 1/2 tablet every day except 1 tablet on Mondays, Wednesdays and Saturdays   Current Anticoagulation Instructions: INR 2.5 Continue taking 1/2 tablet everyday, except take 1 tablet on Wednesdays and Saturdays. Recheck in 4 weeks.

## 2010-08-13 ENCOUNTER — Other Ambulatory Visit (HOSPITAL_COMMUNITY): Payer: 59

## 2010-08-14 ENCOUNTER — Encounter: Payer: Self-pay | Admitting: Internal Medicine

## 2010-08-14 DIAGNOSIS — I82409 Acute embolism and thrombosis of unspecified deep veins of unspecified lower extremity: Secondary | ICD-10-CM

## 2010-08-14 DIAGNOSIS — Z86718 Personal history of other venous thrombosis and embolism: Secondary | ICD-10-CM

## 2010-08-29 ENCOUNTER — Encounter: Payer: Self-pay | Admitting: Internal Medicine

## 2010-08-29 ENCOUNTER — Encounter (INDEPENDENT_AMBULATORY_CARE_PROVIDER_SITE_OTHER): Payer: 59

## 2010-08-29 ENCOUNTER — Ambulatory Visit (HOSPITAL_COMMUNITY): Payer: 59 | Attending: Internal Medicine

## 2010-08-29 DIAGNOSIS — I2699 Other pulmonary embolism without acute cor pulmonale: Secondary | ICD-10-CM

## 2010-08-29 DIAGNOSIS — M79609 Pain in unspecified limb: Secondary | ICD-10-CM

## 2010-08-29 DIAGNOSIS — I82409 Acute embolism and thrombosis of unspecified deep veins of unspecified lower extremity: Secondary | ICD-10-CM | POA: Insufficient documentation

## 2010-08-29 DIAGNOSIS — I519 Heart disease, unspecified: Secondary | ICD-10-CM | POA: Insufficient documentation

## 2010-08-30 ENCOUNTER — Encounter: Payer: Self-pay | Admitting: Vascular Surgery

## 2010-08-30 ENCOUNTER — Telehealth (INDEPENDENT_AMBULATORY_CARE_PROVIDER_SITE_OTHER): Payer: Self-pay | Admitting: *Deleted

## 2010-09-02 LAB — BASIC METABOLIC PANEL
CO2: 28 mEq/L (ref 19–32)
Calcium: 9.1 mg/dL (ref 8.4–10.5)
Chloride: 100 mEq/L (ref 96–112)
Creatinine, Ser: 0.96 mg/dL (ref 0.4–1.5)
GFR calc Af Amer: >60 mL/min (ref >60)
GFR calc Af Amer: >60 mL/min (ref >60)
GFR calc non Af Amer: >60 mL/min (ref >60)
Potassium: 3.9 mEq/L (ref 3.5–5.1)
Sodium: 137 mEq/L (ref 135–145)

## 2010-09-02 LAB — DIFFERENTIAL
Eosinophils Relative: 1 % (ref 0–5)
Lymphocytes Relative: 10 % — ABNORMAL LOW (ref 12–46)
Lymphs Abs: 1.7 10*3/uL (ref 0.7–4.0)

## 2010-09-02 LAB — HEMOGLOBIN A1C: Mean Plasma Glucose: 111 mg/dL (ref <117)

## 2010-09-02 LAB — CBC
HCT: 43.8 % (ref 39.0–52.0)
MCV: 94.8 fL (ref 78.0–100.0)
Platelets: 204 10*3/uL (ref 150–400)
RBC: 4.62 MIL/uL (ref 4.22–5.81)
WBC: 16.1 10*3/uL — ABNORMAL HIGH (ref 4.0–10.5)

## 2010-09-02 LAB — CARDIAC PANEL(CRET KIN+CKTOT+MB+TROPI)
Relative Index: INVALID (ref 0.0–2.5)
Total CK: 49 U/L (ref 7–232)
Troponin I: 0.01 ng/mL (ref 0.00–0.06)

## 2010-09-02 LAB — URINE MICROSCOPIC-ADD ON

## 2010-09-02 LAB — URINALYSIS, ROUTINE W REFLEX MICROSCOPIC
Glucose, UA: NEGATIVE mg/dL
Hgb urine dipstick: NEGATIVE
Ketones, ur: 15 mg/dL — AB
Protein, ur: 30 mg/dL — AB

## 2010-09-02 LAB — PROTIME-INR: Prothrombin Time: 13.8 seconds (ref 11.6–15.2)

## 2010-09-02 LAB — APTT: aPTT: 30 seconds (ref 24–37)

## 2010-09-04 NOTE — Progress Notes (Signed)
Summary: ? venous dopplers  Phone Note Call from Patient Call back at Home Phone [phone]   Caller: Patient Call For: wert Summary of Call: pt returned call from leslie.  Initial call taken by: Tivis Ringer, CNA,  August 30, 2010 2:14 PM  Follow-up for Phone Call        Spoke with pt and notified of the ECHO results per MW append.  Pt unsure when venous dopplers are sched for.  PCC, pls advise thanks! Follow-up by: Vernie Murders,  August 30, 2010 3:33 PM  Additional Follow-up for Phone Call Additional follow up Details #1::        they were scheduled the same day as the echo in the records i don't know if both were done Additional Follow-up by: Oneita Jolly,  August 30, 2010 3:44 PM    Additional Follow-up for Phone Call Additional follow up Details #2::    Pt has already had ECHO, but venous dopplers were not done.  He states that he thought that they were sched for sometime this wk, and when I went to check in EMR, nothing was there stating when they are supposed to be.  Looks like they needs to be scheduled, thanks Follow-up by: Vernie Murders,  August 30, 2010 3:49 PM  Additional Follow-up for Phone Call Additional follow up Details #3:: Details for Additional Follow-up Action Taken: hey leslie pt did the dopplers yersterday 08/29/10 no result yet Additional Follow-up by: Oneita Jolly,  August 30, 2010 4:19 PM   Appended Document: ? venous dopplers discussed rx by vein specialists is a cosmetic concern at this point and won't prevent future clots.

## 2010-09-04 NOTE — Miscellaneous (Signed)
Summary: Orders Update  Clinical Lists Changes  Orders: Added new Test order of Venous Duplex Lower Extremity (Venous Duplex Lower) - Signed 

## 2010-09-05 ENCOUNTER — Encounter: Payer: 59 | Admitting: *Deleted

## 2010-10-01 ENCOUNTER — Other Ambulatory Visit: Payer: 59

## 2010-10-01 ENCOUNTER — Other Ambulatory Visit: Payer: Self-pay | Admitting: Internal Medicine

## 2010-10-01 ENCOUNTER — Encounter: Payer: Self-pay | Admitting: Internal Medicine

## 2010-10-01 ENCOUNTER — Ambulatory Visit (INDEPENDENT_AMBULATORY_CARE_PROVIDER_SITE_OTHER): Payer: 59 | Admitting: Internal Medicine

## 2010-10-01 VITALS — BP 112/78 | HR 51 | Temp 98.2°F | Ht 71.0 in | Wt 205.4 lb

## 2010-10-01 DIAGNOSIS — Z86718 Personal history of other venous thrombosis and embolism: Secondary | ICD-10-CM

## 2010-10-01 DIAGNOSIS — I82409 Acute embolism and thrombosis of unspecified deep veins of unspecified lower extremity: Secondary | ICD-10-CM

## 2010-10-01 NOTE — Assessment & Plan Note (Addendum)
I had an extended discussion with the patient today lasting 15 to 20 minutes of a 25 minute visit on the following issues:  He is at risk of recurrent dvt and PE for the rest of his life and this is potentially life threatening  Discussed in detail all the  indications, usual  risks and alternatives  relative to the benefits with patient who agrees to proceed with asa 325 and max mobility.  See instructions for specific recommendations which were reviewed directly with the patient who was given a copy with highlighter outlining the key components.

## 2010-10-01 NOTE — Assessment & Plan Note (Signed)
Precautions reviwed

## 2010-10-01 NOTE — Progress Notes (Signed)
  Subjective:    Patient ID: Andrew Gilbert, male    DOB: [birth year], 50 y.o.   MRN: [id number]  HPI 59 yowm never smoker freq traveler to [place] on Business with new L calf pain early July 2011 dx with dvt and PE [facility] after presented with pleuritic cp/ hemoptysis, discharged on lovenox bridge to coumadin with residual atx/ effusion L base.  January 10, 2010 1st pulmonary office eval for persistent sob with mild L pleuritic cp with deep breathing but represents marked improvement and hemoptysis has subsided and left leg is nl with last inr 7/12 ok  rec no change rx   April 03, 2010 ov CP and hemoptysis- resolved. walking regularly without sob. no leg swelling. cxr now completely clear, not exercising. rec no change rx for minimum of 6 months   July 27, 2010 ov now @ 6 months feeling fine but still not aerobically active.   10/01/2010 ov off coumadin x one month. Still traveling to [place] once per quarter, good activity tolerance but not much aerobics. No leg swelling. Pt denies any significant sore throat, dysphagia, itching, sneezing,  nasal congestion or excess/ purulent secretions,  fever, chills, sweats, unintended wt loss, pleuritic or exertional cp, hempoptysis, orthopnea pnd or leg swelling.    Also denies any obvious fluctuation of symptoms with weather or environmental changes or other aggravating or alleviating factors.       Allergies   1) ! Pcn   Past Medical History:  Kidney stones  L DVT and PE 12/20/2009  - CT Chest 12/20/09 bilateral PE and LLL infarction > cxr cleared April 03, 2010  - Repeat venous doppler  3/15/ 2012 >>>  wnl - 2d Echo 3/14 2012>>  wnl  - Hypercoagulable profile sent 10/01/2010 off coumadin x one month          Review of Systems     Objective:   Physical Exam amb wm nad  wt 203 January 10, 2010 > 203 April 03, 2010 202 July 28, 2010 > wt 205 10/01/2010  HEENT: nl dentition, turbinates, and orophanx. Nl external ear canals without cough  reflex  NECK : without JVD/Nodes/TM/ nl carotid upstrokes bilaterally  LUNGS: no acc muscle use, clear to A and P bilaterally without cough on insp or exp maneuvers  CV: RRR no s3 or murmur or increase in P2, no edema  ABD: soft and nontender with nl excursion in the supine position. No bruits or organomegaly, bowel sounds nl  EXT warm without deformities, calf tenderness, cyanosis or clubbing  SKIN: warm and dry without lesions x minimal vericose vein changes L calf, no tenerness.       Assessment & Plan:

## 2010-10-01 NOTE — Patient Instructions (Signed)
Continue Aspirin coated 325 mg daily  We will call you with all your lab reports when they return - there may be a delay of up to a week  Call if have any leg swelling - if develop chest pain or unexplained short of breath go to nearest ER   If you are satisfied with your treatment plan let your doctor   If in any way you are not 100% satisfied,  please tell us.  If 100% better, tell your friends!

## 2010-10-04 LAB — HYPERCOAGULABLE PANEL, COMPREHENSIVE
Anticardiolipin IgA: 3 APL U/mL (ref <22)
Anticardiolipin IgG: 9 GPL U/mL (ref <23)
Beta-2-Glycoprotein I IgA: 1 A Units (ref <20)
Beta-2-Glycoprotein I IgM: 14 M Units (ref <20)
PTT Lupus Anticoagulant: 38.9 secs (ref 30.0–45.6)
Protein C, Total: 133 % (ref 72–160)
Protein S Total: 179 % — ABNORMAL HIGH (ref 60–150)

## 2010-10-05 ENCOUNTER — Other Ambulatory Visit: Payer: 59

## 2010-10-05 ENCOUNTER — Other Ambulatory Visit: Payer: Self-pay | Admitting: Internal Medicine

## 2010-10-09 NOTE — Progress Notes (Signed)
Quick Note:  Spoke with pt and notified of results per Dr. [name]. Pt verbalized understanding and denied any questions.  ______ 

## 2011-02-16 IMAGING — CR DG CHEST 2V
2 series · 2 of 2 positions shown · non-contrast
Comparison: 01/10/2010.

CLINICAL DATA: Follow-up pleural effusion.

CHEST - 2 VIEW

[view not recorded (1 of 2)]
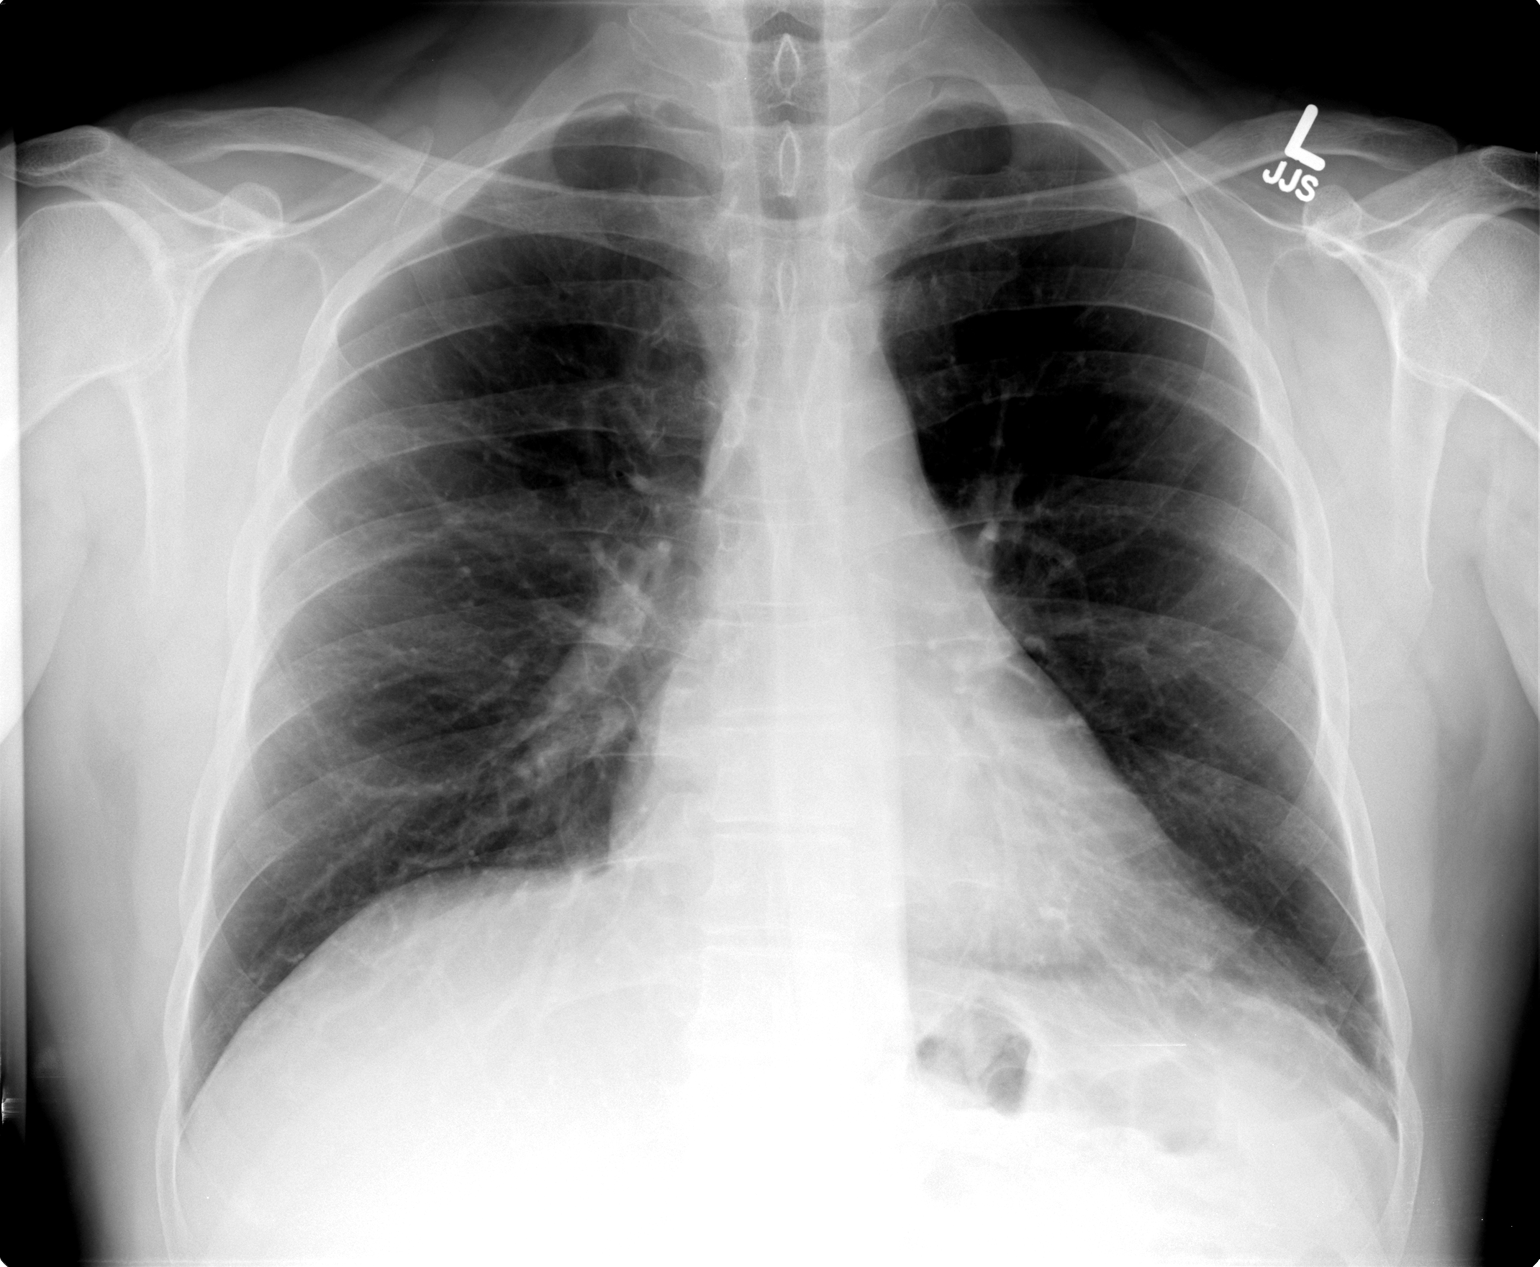

[view not recorded (2 of 2)]
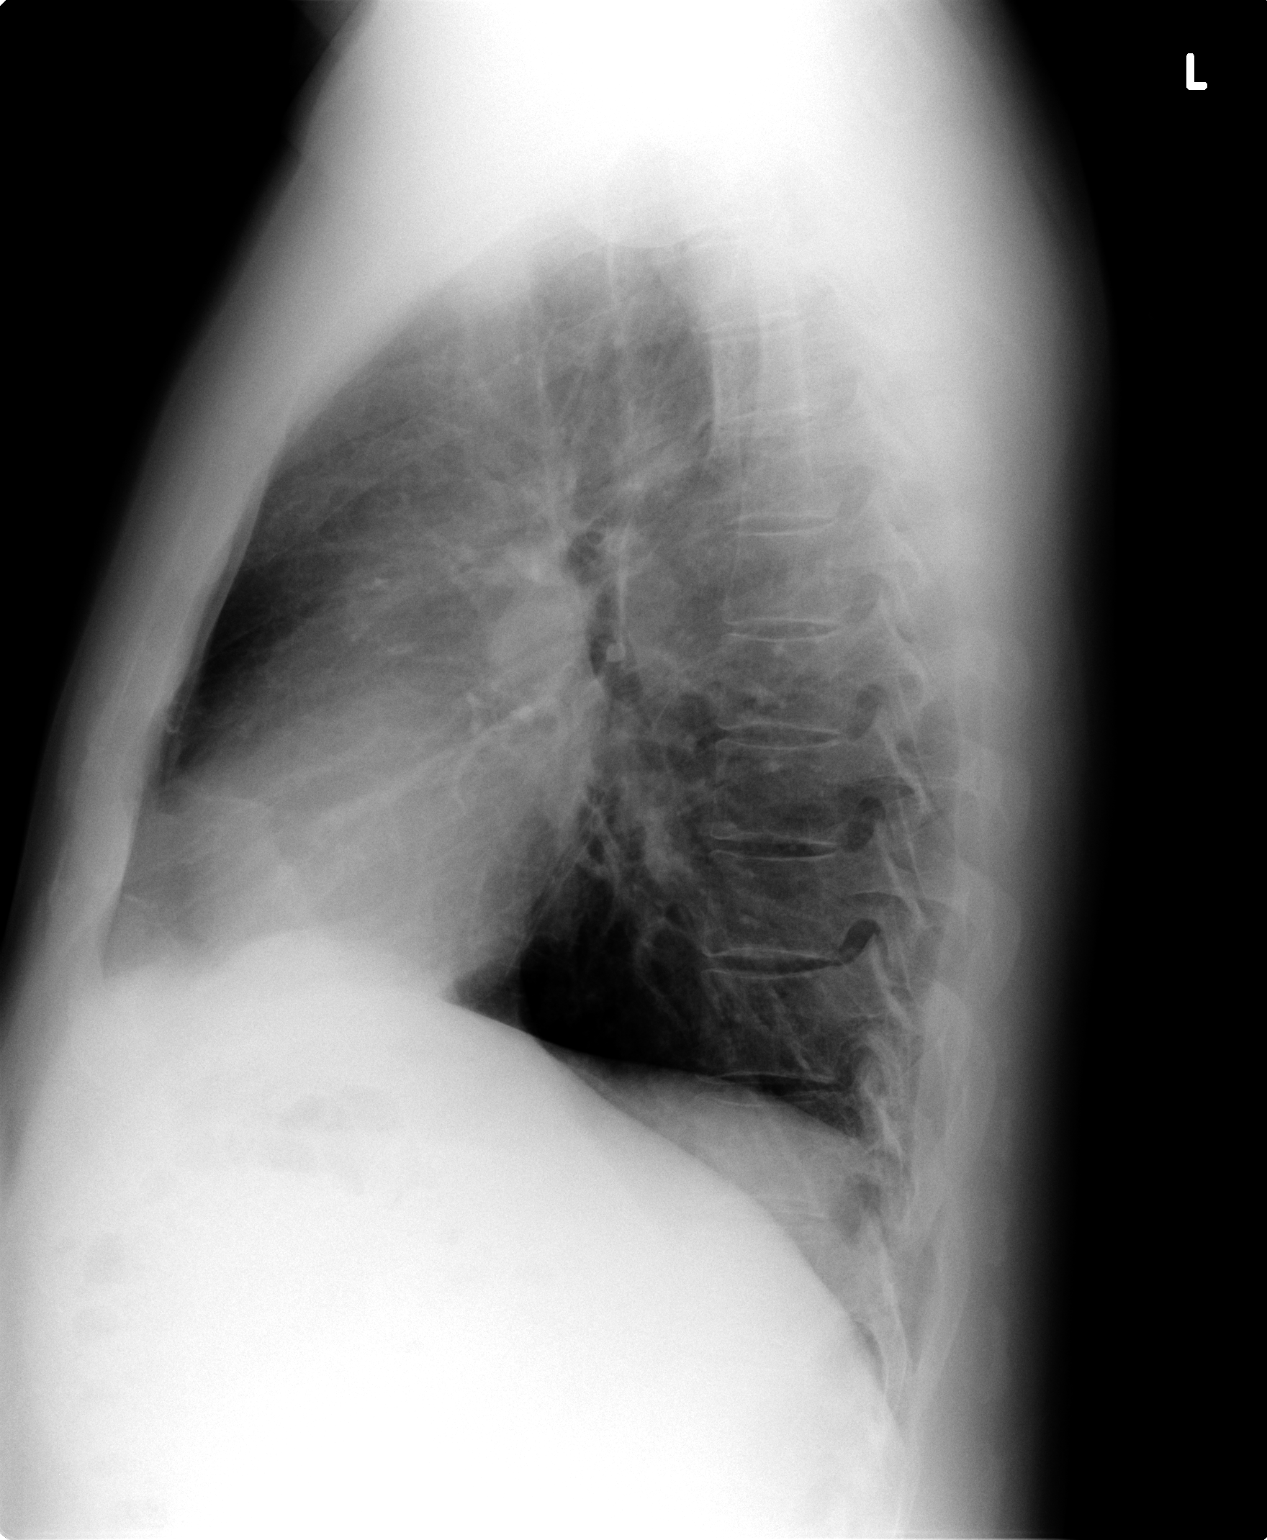

[2 of 2 positions shown; findings below may reference images not displayed]

FINDINGS: The opacity within the left base has cleared.  There is
no pleural effusion present.  There are mildly accentuated
bronchial markings consistent with mild chronic bronchitic change.
The heart and mediastinal structures are normal.
IMPRESSION: Interval clearing of the opacity within the left base.  Mild
chronic bronchitic change.

## 2011-04-09 ENCOUNTER — Ambulatory Visit: Payer: Self-pay | Admitting: Internal Medicine

## 2011-04-09 DIAGNOSIS — I82409 Acute embolism and thrombosis of unspecified deep veins of unspecified lower extremity: Secondary | ICD-10-CM

## 2011-04-09 DIAGNOSIS — Z86718 Personal history of other venous thrombosis and embolism: Secondary | ICD-10-CM

## 2011-08-12 ENCOUNTER — Ambulatory Visit (INDEPENDENT_AMBULATORY_CARE_PROVIDER_SITE_OTHER): Payer: 59 | Admitting: Physician Assistant

## 2011-08-12 DIAGNOSIS — R05 Cough: Secondary | ICD-10-CM

## 2011-08-12 DIAGNOSIS — J069 Acute upper respiratory infection, unspecified: Secondary | ICD-10-CM

## 2011-08-12 MED ORDER — IPRATROPIUM BROMIDE 0.03 % NA SOLN: 2.0000 | Freq: Three times a day (TID) | NASAL | Status: DC

## 2011-08-12 MED ORDER — BENZONATATE 100 MG PO CAPS: ORAL_CAPSULE | ORAL | Status: AC

## 2011-08-12 NOTE — Progress Notes (Signed)
  Subjective:    Patient ID: Andrew Gilbert, male    DOB: 09-17-1960, 51 y.o.   MRN: [id number]  HPI Pt presents to clinic with 3 day h/o cold symptoms.  Flew back from [place] 4 days ago and the next day started to feel sick.  Congestion with yellow/green rhinorrhea with cough - only productive in the shower with green sputum.  PND is present with a tickle in his throat with a resulting cough esp with activity and deep breaths.  No SOB or chest pain. Non smoker.   Review of Systems  Constitutional: Positive for fever (subj). Negative for chills.  HENT: Positive for ear pain, congestion, rhinorrhea and postnasal drip. Negative for sore throat.   Respiratory: Positive for cough. Negative for chest tightness and shortness of breath.   Gastrointestinal: Negative for nausea, vomiting and diarrhea.  Skin: Negative for rash.       Objective:   Physical Exam  Constitutional: He is oriented to person, place, and time. He appears well-developed and well-nourished.  HENT:  Head: Normocephalic and atraumatic.  Right Ear: Hearing, tympanic membrane, external ear and ear canal normal.  Left Ear: Hearing, tympanic membrane, external ear and ear canal normal.  Nose: Mucosal edema present.  Mouth/Throat: Uvula is midline and oropharynx is clear and moist. No oropharyngeal exudate.  Eyes: Conjunctivae are normal.  Cardiovascular: Normal rate, regular rhythm and normal heart sounds.   No murmur heard. Pulmonary/Chest: Effort normal and breath sounds normal.  Lymphadenopathy:    He has no cervical adenopathy.  Neurological: He is alert and oriented to person, place, and time.  Skin: Skin is warm and dry.  Psychiatric: He has a normal mood and affect. His behavior is normal. Judgment and thought content normal.          Assessment & Plan:   1. URI (upper respiratory infection)  ipratropium (ATROVENT) 0.03 % nasal spray  2. Cough  benzonatate (TESSALON) 100 MG capsule   Pt to use OTC generic  mucinex b/c brand name causes jitteriness.  Pt to push fluids, tylenol/motrin prn.  If no better in 7d pt to call for abx.

## 2012-02-15 ENCOUNTER — Ambulatory Visit (INDEPENDENT_AMBULATORY_CARE_PROVIDER_SITE_OTHER): Payer: 59 | Admitting: Family Medicine

## 2012-02-15 VITALS — BP 126/70 | HR 78 | Temp 98.6°F | Resp 18 | Ht 69.5 in | Wt 211.0 lb

## 2012-02-15 DIAGNOSIS — H9319 Tinnitus, unspecified ear: Secondary | ICD-10-CM

## 2012-02-15 DIAGNOSIS — C4491 Basal cell carcinoma of skin, unspecified: Secondary | ICD-10-CM

## 2012-02-15 NOTE — Progress Notes (Signed)
51 year old gentleman comes in with a right temporal lesion that his wife noticed several weeks ago. Patient has long history of right greater than left high pitched tinnitus without hearing loss. 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

## 2012-04-12 ENCOUNTER — Ambulatory Visit (INDEPENDENT_AMBULATORY_CARE_PROVIDER_SITE_OTHER): Payer: BC Managed Care – PPO | Admitting: Family Medicine

## 2012-04-12 ENCOUNTER — Ambulatory Visit (HOSPITAL_COMMUNITY)
Admission: RE | Admit: 2012-04-12 | Discharge: 2012-04-12 | Disposition: A | Discharge status: Home / Self Care | Payer: BC Managed Care – PPO | Source: Ambulatory Visit | Attending: Family Medicine | Admitting: Family Medicine

## 2012-04-12 VITALS — BP 146/87 | HR 77 | Temp 98.3°F | Resp 16 | Ht 71.0 in | Wt 209.4 lb

## 2012-04-12 DIAGNOSIS — Z86718 Personal history of other venous thrombosis and embolism: Secondary | ICD-10-CM

## 2012-04-12 DIAGNOSIS — I80299 Phlebitis and thrombophlebitis of other deep vessels of unspecified lower extremity: Secondary | ICD-10-CM | POA: Insufficient documentation

## 2012-04-12 DIAGNOSIS — M7989 Other specified soft tissue disorders: Secondary | ICD-10-CM

## 2012-04-12 DIAGNOSIS — M79669 Pain in unspecified lower leg: Secondary | ICD-10-CM

## 2012-04-12 DIAGNOSIS — M79609 Pain in unspecified limb: Secondary | ICD-10-CM | POA: Insufficient documentation

## 2012-04-12 MED ORDER — ENOXAPARIN SODIUM 100 MG/ML ~~LOC~~ SOLN: 100.0000 mg | Freq: Two times a day (BID) | SUBCUTANEOUS | Status: DC

## 2012-04-12 NOTE — Progress Notes (Signed)
VASCULAR LAB PRELIMINARY  PRELIMINARY  PRELIMINARY  PRELIMINARY  Left lower extremity venous Doppler completed.    Preliminary report:  There is no DVT noted in the left lower extremity.  There is superficial thrombophlebitis noted in a varicosity mid posterior calf to the popliteal fossa.  Therisa Mennella, 04/12/2012, 6:38 PM

## 2012-04-12 NOTE — Patient Instructions (Signed)
[facility]  for OUTPATIENT registration - scheduled ultrasound at 5:30pm tonight.  Hold on taking Lovenox until we have results of this study.  If any chest pain or difficulty breathing check into emergency room.

## 2012-04-12 NOTE — Progress Notes (Signed)
  Subjective:    Patient ID: Andrew Gilbert, male    DOB: 1961/06/10, 51 y.o.   MRN: [id number]  HPI Andrew Gilbert is a 51 y.o. male Hx of leg pain L calf- upper L calf - started 4 days ago.  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  Drove 3 hours to [place] 8 days ago (father passed away).  Pain started  4 days ago.   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 More discomfort at that time, but in same area. At that time - thought be due to soft tissue injury.  hypercoag workup was negative.  Treated with coumadin for approx. 9 months.   Still on 81mg  ASA QD.   No chest pain, no dyspnea.   Tx: none.   Review of Systems  Constitutional: Negative for fever and chills.  Respiratory: Negative for chest tightness and shortness of breath.   Cardiovascular: Negative for chest pain.  Musculoskeletal: Positive for myalgias. Negative for joint swelling.       Objective:   Physical Exam  Constitutional: He appears well-developed and well-nourished. No distress.  HENT:  Head: Normocephalic and atraumatic.  Cardiovascular: Normal rate, regular rhythm, normal heart sounds and intact distal pulses.   Pulmonary/Chest: Effort normal and breath sounds normal.  Musculoskeletal:       Legs: Neurological:       nvi distally.   Skin: Skin is warm and dry.  Psychiatric: He has a normal mood and affect. His behavior is normal.      Assessment & Plan:  Andrew Gilbert is a 51 y.o. male 1. Calf pain  enoxaparin (LOVENOX) 100 MG/ML injection, Lower Extremity Venous Duplex Left  2. Calf swelling  enoxaparin (LOVENOX) 100 MG/ML injection, Lower Extremity Venous Duplex Left  3. History of DVT (deep vein thrombosis)  enoxaparin (LOVENOX) 100 MG/ML injection, Lower Extremity Venous Duplex Left   Hx of DVT 2 years ago - now with recent swelling/pain - again in L calf in area of prior varicosities - possible superficial thrombophlebitis. No chest pain or dyspnea.   Able to  obtain doppler today - Lovenox rx, but do not fill until results of study (stop ASA only if having to dose Lovenox).   Addendum - spoke with vascular lab. No DVT, just superficial thrombophlebitis and varicosity in affected leg.  Advised pt - restart ASA, No lovenox. Heat, rom, nsaid over the counter prn and recheck in 4 days - may need to see vascular/vein to discuss further.

## 2012-04-14 ENCOUNTER — Telehealth: Payer: Self-pay

## 2012-04-14 NOTE — Telephone Encounter (Signed)
Message copied by Vicenta Dunning on Tue Apr 14, 2012  5:32 PM ------      Message from: [place], [place] R      Created: Tue Apr 14, 2012  3:28 PM       Call pt - check status. Make sure he follows up with me Thursday.  We can discuss his superficial thrombophlebitis further at that time.  We may consider using a blood thinner called Arixtra for 4 weeks as he has had a previous DVT. Let me know if there are any questions, and rtc sooner if any worsening.

## 2012-04-14 NOTE — Telephone Encounter (Signed)
LMOM for pt. Advised pt we are checking on him for Dr Neva Seat and to remind him to f/up on Thurs. Asked pt to CB if he has any ?s and to RTC sooner if worsening.

## 2012-04-16 ENCOUNTER — Encounter: Payer: Self-pay | Admitting: Family Medicine

## 2012-04-16 ENCOUNTER — Ambulatory Visit (INDEPENDENT_AMBULATORY_CARE_PROVIDER_SITE_OTHER): Payer: BC Managed Care – PPO | Admitting: Family Medicine

## 2012-04-16 VITALS — BP 126/83 | HR 67 | Temp 98.4°F | Resp 16 | Ht 71.0 in | Wt 210.8 lb

## 2012-04-16 DIAGNOSIS — I8000 Phlebitis and thrombophlebitis of superficial vessels of unspecified lower extremity: Secondary | ICD-10-CM

## 2012-04-16 NOTE — Progress Notes (Signed)
  Subjective:    Patient ID: Andrew Gilbert, male    DOB: [birth year], 51 y.o.   MRN: [id number]  HPI Andrew Gilbert is a 51 y.o. male See 04/12/12 office visit - in summary -   leg pain L calf- upper L calf - started 4 days prior.  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  Drove 3 hours to [place] 8 days ago (father passed away).  Pain started  4 days ago. 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 More discomfort at that time, but in same area. At that time - thought be due to soft tissue injury.  hypercoag workup was negative.  Treated with coumadin for approx. 9 months.   Still on 81mg  ASA QD. No chest pain, no dyspnea. 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  04/12/12 doppler VOZ:[id number]: - No evidence of deep vein thrombosis involving the left lower extremity and right common femoral vein. There is superficial thrombophlebitis noted in a varicosity from mid posterior calf to popliteal fossa. - No evidence of Baker's cyst on the left. Called pt day of study - restart ASA, No lovenox. Heat, rom, nsaid over the counter prn and recheck in 4 days.  Here for follow up. Feels better.  Improving starting 2 days ago - less sore, less firmness in area. No pain into lower calf or upper leg ar any new areas of swelling or soreness.   Tx: aspirin 325mg  every 4-5 hours. Heating pad at night.    Review of Systems As above.  No chest pain/dyspnea, or any increase in pain or swelling.     Objective:   Physical Exam  Vitals reviewed. Constitutional: He is oriented to person, place, and time. He appears well-developed and well-nourished.  Cardiovascular: Normal rate, regular rhythm, normal heart sounds and intact distal pulses.   Pulmonary/Chest: Effort normal and breath sounds normal.  Musculoskeletal:       Legs:      Negative Homan's.  Min ttp L proximal/lateral calf.     Neurological: He is alert and  oriented to person, place, and time.  Skin: Skin is warm and dry.    Calf circumference at 15 cm below patella: 43.5cm on L, 42 cm on Right.    Assessment & Plan:  Andrew Gilbert is a 51 y.o. male 1. Thrombophlebitis of superficial veins of lower extremity    Superficial thrombophlebitis - improving.  Improving on Aspirin (taking this instead of NSAID).  compression stockings rx.  Will discuss possiblility of Arixtra with vascular or heme tomorrow.  RTC/ER precautions discussed.    Addendum 04/17/12.  Discussed with hematology.  As no known hypercoagulable condition, and improving symptoms with sx care, would not start other anticoagulant at this point.  Ok to take 1 asa qd (not as frequent as present due to GI risks), then NSAID otc prn, compression, heat, and rtc precautions  As discussed last night.  Called pt - left message re: no change in plan, but can call him again. If any progression of symptoms - or not continuing to improve - recheck - may also consider repeat doppler in 4- 6 weeks for resolution verification.

## 2012-05-03 ENCOUNTER — Ambulatory Visit: Payer: BC Managed Care – PPO

## 2012-05-03 ENCOUNTER — Ambulatory Visit (INDEPENDENT_AMBULATORY_CARE_PROVIDER_SITE_OTHER): Payer: BC Managed Care – PPO | Admitting: Internal Medicine

## 2012-05-03 VITALS — BP 145/77 | HR 96 | Temp 98.3°F | Resp 16 | Ht 69.5 in | Wt 204.0 lb

## 2012-05-03 DIAGNOSIS — R05 Cough: Secondary | ICD-10-CM

## 2012-05-03 DIAGNOSIS — R1011 Right upper quadrant pain: Secondary | ICD-10-CM

## 2012-05-03 DIAGNOSIS — R109 Unspecified abdominal pain: Secondary | ICD-10-CM

## 2012-05-03 DIAGNOSIS — R042 Hemoptysis: Secondary | ICD-10-CM

## 2012-05-03 LAB — COMPREHENSIVE METABOLIC PANEL
ALT: 23 U/L (ref 0–53)
AST: 13 U/L (ref 0–37)
Alkaline Phosphatase: 71 U/L (ref 39–117)
BUN: 13 mg/dL (ref 6–23)
Chloride: 100 mEq/L (ref 96–112)
Creat: 0.89 mg/dL (ref 0.50–1.35)
Total Bilirubin: 0.8 mg/dL (ref 0.3–1.2)

## 2012-05-03 LAB — POCT URINALYSIS DIPSTICK
Bilirubin, UA: NEGATIVE
Leukocytes, UA: NEGATIVE
Nitrite, UA: NEGATIVE
Urobilinogen, UA: 0.2
pH, UA: 6

## 2012-05-03 LAB — POCT CBC
HCT, POC: 50 % (ref 43.5–53.7)
Lymph, poc: 2.3 (ref 0.6–3.4)
MCH, POC: 29.3 pg (ref 27–31.2)
MCHC: 30 g/dL — AB (ref 31.8–35.4)
MPV: 8.6 fL (ref 0–99.8)
POC Granulocyte: 11.5 — AB (ref 2–6.9)
POC LYMPH PERCENT: 215.5 %L — AB (ref 10–50)
POC MID %: 6.4 %M (ref 0–12)
RDW, POC: 13.1 %
WBC: 14.7 10*3/uL — AB (ref 4.6–10.2)

## 2012-05-03 LAB — POCT UA - MICROSCOPIC ONLY
Casts, Ur, LPF, POC: NEGATIVE
Epithelial cells, urine per micros: NEGATIVE
Mucus, UA: NEGATIVE
WBC, Ur, HPF, POC: NEGATIVE
Yeast, UA: NEGATIVE

## 2012-05-03 NOTE — Progress Notes (Addendum)
Subjective:    Patient ID: Andrew Gilbert, male    DOB: [birth year], 51 y.o.   MRN: [id number]  HPI Dx with a blood clot, superfic L calf w/ normal doppler=no DVT,  three weeks ago by Dr. Neva Seat. They decided to watch the clot. Resolved over 2 weeks Not on any blood thinners x ASA.  [place] to [place] on Friday night and started having pain in the back. Come and go. Sharp and sudden w/out ppting factors at first but yesterday had 2 episodes R flank colicky pain following eating--lasting 10 minutes-intense enough to cause nausea. Resolved spontaneously. No assoc palpit,diaphor,SOB, Ant chest pain, or vomiting. Has hx stones but no recent urinary symptoms/hematuria On awakening today, coughed up a little bit of blood. Has has recent sinus congestion w/ PND.  Change in position does not recreate pain.  Patient still has gall bladder.   Patient has a hx of pulmonary emboli and this does have the same feeling as that.  Has a hx of kidney stones and this pain is similar to this. No tenderness in leg.  Dr. Neva Seat measured his calves last time he was in and his left leg was bigger than the right.  Never figured out what caused his pulmonary emboli before-Dr Sherene Sires did hypercoag w/u and was OK    PMH-no meds No ongoing illnesses Review of Systems No fever chills night sweats No fatigue     Objective:   Physical Exam= Filed Vitals:   05/03/12 0815  BP: 145/77  Pulse: 96  Temp: 98.3 F (36.8 C)  Resp: 16  in NAD HEENT clear except boggy turbs w/ scant d/c Heart reg at 74 with no m,r,g Lungs clear to ausc abd-benigh Flank nontender to palp and percuss SLR neg Objective:   Physical Exam   [facility] reading (PRIMARY) by  Dr.Doolittle=No acute changes-? Blunted CP angles      Results for orders placed in visit on 05/03/12  POCT CBC      Component Value Range   WBC 14.7 (*) 4.6 - 10.2 K/uL   Lymph, poc 2.3  0.6 - 3.4   POC LYMPH PERCENT 215.5 (*) 10 - 50 %L   MID (cbc) 0.9  0 - 0.9   POC  MID % 6.4  0 - 12 %M   POC Granulocyte 11.5 (*) 2 - 6.9   Granulocyte percent 78.1  37 - 80 %G   RBC 5.12  4.69 - 6.13 M/uL   Hemoglobin 15.0  14.1 - 18.1 g/dL   HCT, POC 30.8  65.7 - 53.7 %   MCV 97.6 (*) 80 - 97 fL   MCH, POC 29.3  27 - 31.2 pg   MCHC 30.0 (*) 31.8 - 35.4 g/dL   RDW, POC 84.6     Platelet Count, POC 270  142 - 424 K/uL   MPV 8.6  0 - 99.8 fL  POCT UA - MICROSCOPIC ONLY      Component Value Range   WBC, Ur, HPF, POC neg     RBC, urine, microscopic 0-1     Bacteria, U Microscopic neg     Mucus, UA neg     Epithelial cells, urine per micros neg     Crystals, Ur, HPF, POC neg     Casts, Ur, LPF, POC neg     Yeast, UA neg    POCT URINALYSIS DIPSTICK      Component Value Range   Color, UA yellow     Gilbert,  UA clear     Glucose, UA neg     Bilirubin, UA neg     Ketones, UA neg     Spec Grav, UA 1.020     Blood, UA trace     pH, UA 6.0     Protein, UA neg     Urobilinogen, UA 0.2     Nitrite, UA neg     Leukocytes, UA Negative       Assessment & Plan:  1)Pain flank and R axillary line-intermittent(RUQ abd pain post prandial) 2)leucocytosis 3)hx PTE 4) hemoptysis  Check LFTs CT LUNG Abd [place] Close f/u- to ER if worsens   Addendum: Abd [place] negative CT Chest-Dr Gery Pray just called and he has multiple emboli w/ emerging RLL infarct PCP Dr Milus Glazier Advised to come over to start anticoagulation now Needs ref back to Dr Sherene Sires as well(cared for 1st PTE)

## 2012-05-03 NOTE — Progress Notes (Deleted)
  Subjective:    Patient ID: Andrew Gilbert, male    DOB: 03/21/1961, 51 y.o.   MRN: [id number]  HPI     Review of Systems     Objective:   Physical Exam        Assessment & Plan:

## 2012-05-05 ENCOUNTER — Ambulatory Visit
Admission: RE | Admit: 2012-05-05 | Discharge: 2012-05-05 | Disposition: A | Discharge status: Home / Self Care | Payer: BC Managed Care – PPO | Source: Ambulatory Visit | Attending: Internal Medicine | Admitting: Internal Medicine

## 2012-05-05 DIAGNOSIS — R1011 Right upper quadrant pain: Secondary | ICD-10-CM

## 2012-05-06 ENCOUNTER — Ambulatory Visit
Admission: RE | Admit: 2012-05-06 | Discharge: 2012-05-06 | Disposition: A | Discharge status: Home / Self Care | Payer: BC Managed Care – PPO | Source: Ambulatory Visit | Attending: Internal Medicine | Admitting: Internal Medicine

## 2012-05-06 ENCOUNTER — Ambulatory Visit (INDEPENDENT_AMBULATORY_CARE_PROVIDER_SITE_OTHER): Payer: BC Managed Care – PPO | Admitting: Internal Medicine

## 2012-05-06 VITALS — BP 140/88 | HR 93 | Temp 99.0°F | Resp 16 | Ht 69.75 in | Wt 202.0 lb

## 2012-05-06 DIAGNOSIS — R109 Unspecified abdominal pain: Secondary | ICD-10-CM

## 2012-05-06 DIAGNOSIS — R042 Hemoptysis: Secondary | ICD-10-CM

## 2012-05-06 DIAGNOSIS — R05 Cough: Secondary | ICD-10-CM

## 2012-05-06 DIAGNOSIS — I2699 Other pulmonary embolism without acute cor pulmonale: Secondary | ICD-10-CM

## 2012-05-06 MED ORDER — IOHEXOL 350 MG/ML SOLN
125.0000 mL | Freq: Once | INTRAVENOUS | Status: AC | PRN
  Administered 2012-05-06: 125 mL via INTRAVENOUS

## 2012-05-06 MED ORDER — ENOXAPARIN SODIUM 100 MG/ML ~~LOC~~ SOLN: 1.0000 mg/kg | Freq: Two times a day (BID) | SUBCUTANEOUS | Status: DC

## 2012-05-06 MED ORDER — WARFARIN SODIUM 5 MG PO TABS: 5.0000 mg | ORAL_TABLET | Freq: Every day | ORAL | Status: DC

## 2012-05-06 MED ORDER — FLUTICASONE PROPIONATE 50 MCG/ACT NA SUSP: 2.0000 | Freq: Every day | NASAL | Status: DC

## 2012-05-06 NOTE — Patient Instructions (Signed)
Stop your aspirin Start Lovenox injections every 12 hours subcutaneously Start Coumadin 5 mg daily We will set up followup with the [facility] tomorrow 10:45

## 2012-05-06 NOTE — Progress Notes (Addendum)
  Subjective:    Patient ID: Andrew Gilbert, male    DOB: [birth year], 51 y.o.   MRN: [id number]  HPI CT positive for pulmonary emboli with possible infarct He remains asymptomatic except for episodic pain at the right costal margin laterally and posteriorly No fever or shortness of breath No palpitations   Review of Systems No fever or night sweats No recent weight loss or weight gain No symptoms of deep vein thrombosis     Objective:   Physical Exam Vital signs stable with pulse ox 98 Lungs remain clear Heart regular without murmur  IMPRESSION:  1. There are pulmonary emboli in secondary branches of the right  lower lobe and left lower lobe pulmonary arteries.  2. Parenchymal opacity deep in the posterior right lower lobe lung  sulcus may represent an area of developing pulmonary infarction.  3. 3 mm noncalcified nodule in the left upper lobe. If the  patient is considered high risk, follow-up CT of the chest may be  warranted in 6-12 months.       Assessment & Plan:  Problem #1 pulmonary emboli- #2episode  Started Lovenox 1 mg per kilogram twice a day and Coumadin 5 mg and set up for further evaluation at the Coumadin clinic/[place] where he has seen Dr. Sherene Sires in the past Meds ordered this encounter  Medications  . warfarin (COUMADIN) 5 MG tablet    Sig: Take 1 tablet (5 mg total) by mouth daily.    Dispense:  30 tablet    Refill:  3  . enoxaparin (LOVENOX) 100 MG/ML injection    Sig: Inject 0.9 mLs (90 mg total) into the skin every 12 (twelve) hours.    Dispense:  10 Syringe    Refill:  0  . fluticasone (FLONASE) 50 MCG/ACT nasal spray    Sig: Place 2 sprays into the nose daily.    Dispense:  16 g    Refill:  6                                                        started as he was overusing Afrin for allergic rhinitis    Referred for bilateral lower extremity ultrasound

## 2012-05-07 ENCOUNTER — Encounter: Payer: Self-pay | Admitting: Internal Medicine

## 2012-05-07 ENCOUNTER — Telehealth: Payer: Self-pay

## 2012-05-07 ENCOUNTER — Ambulatory Visit (INDEPENDENT_AMBULATORY_CARE_PROVIDER_SITE_OTHER): Payer: BC Managed Care – PPO | Admitting: Internal Medicine

## 2012-05-07 ENCOUNTER — Ambulatory Visit
Admission: RE | Admit: 2012-05-07 | Discharge: 2012-05-07 | Disposition: A | Discharge status: Home / Self Care | Payer: BC Managed Care – PPO | Source: Ambulatory Visit | Attending: Internal Medicine | Admitting: Internal Medicine

## 2012-05-07 VITALS — BP 116/80 | HR 73 | Temp 97.9°F | Ht 70.0 in | Wt 206.0 lb

## 2012-05-07 DIAGNOSIS — Z86718 Personal history of other venous thrombosis and embolism: Secondary | ICD-10-CM

## 2012-05-07 DIAGNOSIS — I2699 Other pulmonary embolism without acute cor pulmonale: Secondary | ICD-10-CM

## 2012-05-07 MED ORDER — RIVAROXABAN 20 MG PO TABS: 20.0000 mg | ORAL_TABLET | Freq: Every day | ORAL | Status: DC

## 2012-05-07 MED ORDER — RIVAROXABAN 15 MG PO TABS: ORAL_TABLET | ORAL | Status: DC

## 2012-05-07 NOTE — Assessment & Plan Note (Addendum)
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  Discussed in detail all the  indications, usual  risks and alternatives  relative to the benefits with patient who agrees to proceed with xarelto rx after he holds his lovenox x one dose to have a basal cell removed.

## 2012-05-07 NOTE — Telephone Encounter (Signed)
Please review please

## 2012-05-07 NOTE — Progress Notes (Signed)
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   January 10, 2010 1st pulmonary office eval for persistent sob with mild L pleuritic cp with deep breathing but represents marked improvement and hemoptysis has subsided and left leg is nl with last inr 7/12 ok  rec no change rx   April 03, 2010 ov CP and hemoptysis- resolved. walking regularly without sob. no leg swelling. cxr now completely clear, not exercising. rec no change rx for minimum of 6 months   July 27, 2010 ov now @ 6 months feeling fine but still not aerobically active.   10/01/2010 ov off coumadin x one month. Still traveling to [place] once per quarter, good activity tolerance but not much aerobics. No leg swelling.  rec Continue Aspirin coated 325 mg daily We will call you with all your lab reports > neg coag profile Call if have any leg swelling - if develop chest pain or unexplained short of breath go to nearest ER   05/07/2012 f/u ov/Cheron Pasquarelli cc recurrent cp r post and CT chest 05/06/12 recurrent PE rx lovenox /coumadin no sob or cp. Would like to discuss management options - cp already gone, no sob, no leg swelling  Sleeping ok without nocturnal  or early am exacerbation  of respiratory  c/o's or need for noct saba. Also denies any obvious fluctuation of symptoms with weather or environmental changes or other aggravating or alleviating factors except as outlined above  ROS  The following are not active complaints unless bolded sore throat, dysphagia, dental problems, itching, sneezing,  nasal congestion or excess/ purulent secretions, ear ache,   fever, chills, sweats, unintended wt loss, pleuritic or exertional cp, hemoptysis,  orthopnea pnd or  leg swelling, presyncope, palpitations, heartburn, abdominal pain, anorexia, nausea, vomiting, diarrhea  or change in bowel or urinary habits, change in stools or urine, dysuria,hematuria,  rash, arthralgias, visual complaints, headache, numbness weakness or ataxia or problems with walking or coordination,  change in mood/affect or memory.         Allergies   1) ! 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 Nl external ear canals without cough reflex  NECK : without JVD/Nodes/TM/ nl carotid upstrokes bilaterally  LUNGS: no acc muscle use, clear to A and P bilaterally without cough on insp or exp maneuvers  CV: RRR no s3 or murmur or increase in P2, no edema  ABD: soft and nontender with nl excursion in the supine position. No bruits or organomegaly, bowel sounds nl  EXT warm without deformities, calf tenderness, cyanosis or clubbing  SKIN: warm and dry without lesions  x minimal vericose vein changes L calf, no tenerness.       Assessment & Plan:

## 2012-05-07 NOTE — Patient Instructions (Addendum)
Continue lovenox shots until you have your skin surgery (stop it 12 hours before) Immediately after skin surgery once the bleeding stops for 4 hours start xarelto 15 mg twice daily x 21 days then 20 mg daily thereafter  Based on your recurrent clots you will need to stay on some form of anticoagulation indefinitely - call if any questions.  Refills can be though Dr Doolittle's office or return here for follow up (it's up to you  With Dr Doolittle's imput)

## 2012-05-07 NOTE — Telephone Encounter (Signed)
Pt  Completed the ultrasound for DVT.  He is concerned about the findings and would like a call from Dr. Merla Riches.    Best  # [phone].

## 2012-05-08 ENCOUNTER — Telehealth: Payer: Self-pay

## 2012-05-08 NOTE — Telephone Encounter (Signed)
Pt's wife calling to say that her husband had his recheck on pulmonary embolism and the technician said she still sees some clot there,so wife is very concerned and wants to know if we have received the report And if this is something they need to be concerned about.   Best phone [phone] or [phone]

## 2012-05-08 NOTE — Telephone Encounter (Signed)
It looks like the [place] did not reveal any deep vein thrombosis.  It did show a superficial thrombophlebitis which is less concerning.  He is already anticoagulated due to his pulmonary emboli.  Would treat the superficial clot as before with warm compresses, compression stockings.  There are surgical interventions for varicosities that he can discuss with Dr. Merla Riches at his next follow up.

## 2012-05-08 NOTE — Telephone Encounter (Signed)
Spoke with patient and let him know that there was no deep vein thrombosis and he could do warm compresses and compression stockings.  He will schedule a visit with Dr. Merla Riches.

## 2012-08-24 ENCOUNTER — Other Ambulatory Visit: Payer: Self-pay | Admitting: Internal Medicine

## 2012-10-06 ENCOUNTER — Other Ambulatory Visit: Payer: Self-pay | Admitting: Internal Medicine

## 2012-11-03 ENCOUNTER — Telehealth: Payer: Self-pay | Admitting: Internal Medicine

## 2012-11-03 MED ORDER — RIVAROXABAN 20 MG PO TABS: 20.0000 mg | ORAL_TABLET | Freq: Every day | ORAL | Status: DC

## 2012-11-03 NOTE — Telephone Encounter (Signed)
Pt called wanting to know why his xarelto refill was denied. Per refill requests it states to get refills through PCP. Pt states he has not f/u with PCP yet, has an appt next week. He states he will discuss with PCP at that time about taking over refills but he will run out of meds in the meantime. I advised I will send in a refill. Carron Curie, CMA

## 2012-11-10 ENCOUNTER — Ambulatory Visit (INDEPENDENT_AMBULATORY_CARE_PROVIDER_SITE_OTHER): Payer: BC Managed Care – PPO | Admitting: Family Medicine

## 2012-11-10 ENCOUNTER — Encounter: Payer: Self-pay | Admitting: Family Medicine

## 2012-11-10 VITALS — BP 130/70 | HR 88 | Temp 97.4°F | Resp 16 | Ht 69.5 in | Wt 190.2 lb

## 2012-11-10 DIAGNOSIS — R002 Palpitations: Secondary | ICD-10-CM

## 2012-11-10 DIAGNOSIS — N402 Nodular prostate without lower urinary tract symptoms: Secondary | ICD-10-CM

## 2012-11-10 DIAGNOSIS — L989 Disorder of the skin and subcutaneous tissue, unspecified: Secondary | ICD-10-CM

## 2012-11-10 DIAGNOSIS — N400 Enlarged prostate without lower urinary tract symptoms: Secondary | ICD-10-CM

## 2012-11-10 DIAGNOSIS — Z0000 Encounter for general adult medical examination without abnormal findings: Secondary | ICD-10-CM

## 2012-11-10 LAB — POCT URINALYSIS DIPSTICK
Bilirubin, UA: NEGATIVE
Blood, UA: NEGATIVE
Glucose, UA: NEGATIVE
Ketones, UA: NEGATIVE
Leukocytes, UA: NEGATIVE
Nitrite, UA: NEGATIVE
Protein, UA: NEGATIVE
Spec Grav, UA: <=1.005
Urobilinogen, UA: 0.2
pH, UA: 7

## 2012-11-10 LAB — IFOBT (OCCULT BLOOD): IFOBT: NEGATIVE

## 2012-11-10 NOTE — Progress Notes (Signed)
  Subjective:    Patient ID: Andrew Gilbert, male    DOB: [birth year], 52 y.o.   MRN: [id number]  HPI    Review of Systems  Constitutional: Positive for appetite change.  HENT: Positive for tinnitus.   Eyes: Positive for itching.  Cardiovascular: Positive for palpitations.  Genitourinary: Positive for frequency.  Psychiatric/Behavioral: Positive for sleep disturbance.       Objective:   Physical Exam        Assessment & Plan:

## 2012-11-10 NOTE — Progress Notes (Signed)
Patient ID: Andrew Gilbert MRN: [id number], DOB: 1961-04-13 52 y.o. Date of Encounter: 11/10/2012, 4:22 PM  Primary Physician: Elvina Sidle, MD  Chief Complaint: Physical (CPE)  HPI: 52 y.o. y/o male with history noted below here for CPE.  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  He is married. He notes occasional palpitations at night and wakes up every night having trouble getting back to sleep.    Review of Systems: Consitutional: No fever, chills, fatigue, night sweats, lymphadenopathy, or weight changes. Eyes: No visual changes, eye redness, or discharge. ENT/Mouth: Ears: No otalgia, tinnitus, hearing loss, discharge. Nose: No congestion, rhinorrhea, sinus pain, or epistaxis. Throat: No sore throat, post nasal drip, or teeth pain. Cardiovascular: No CP,  diaphoresis, DOE, edema, orthopnea, PND.  Notes  Palpitations at bedtime once or twice a month. Respiratory: No cough, hemoptysis, SOB, or wheezing.  Notes nocturnal snoring Gastrointestinal: No anorexia, dysphagia, reflux, pain, nausea, vomiting, hematemesis, diarrhea, constipation, BRBPR, or melena. Genitourinary: No dysuria, frequency, urgency, hematuria, incontinence, nocturia, decreased urinary stream, discharge, impotence, or testicular pain/masses. Musculoskeletal: No decreased ROM, myalgias, stiffness, joint swelling, or weakness. Skin: No rash, erythema, lesion changes, pain, warmth, jaundice, or pruritis. Neurological: No headache, dizziness, syncope, seizures, tremors, memory loss, coordination problems, or paresthesias. Psychological: No anxiety, depression, hallucinations, SI/HI. Endocrine: No fatigue, polydipsia, polyphagia, polyuria, or known diabetes. All other systems were reviewed and are otherwise negative.  Past Medical History  Diagnosis Date  . Pulmonary embolism   . Phlebitis   . Allergy      History reviewed. No pertinent past  surgical history.  Home Meds:  Prior to Admission medications   Medication Sig Start Date End Date Taking? Authorizing Provider  Rivaroxaban (XARELTO) 20 MG TABS Take 1 tablet (20 mg total) by mouth daily. 11/03/12  Yes Nyoka Cowden, MD  fluticasone (FLONASE) 50 MCG/ACT nasal spray Place 2 sprays into the nose daily. 05/06/12   Tonye Pearson, MD  ipratropium (ATROVENT) 0.03 % nasal spray Place 2 sprays into the nose 3 (three) times daily. 08/12/11 08/11/12  Morrell Riddle, PA-C  warfarin (COUMADIN) 5 MG tablet Take 1 tablet (5 mg total) by mouth daily. 05/06/12   Tonye Pearson, MD    Allergies:  Allergies  Allergen Reactions  . Penicillins     REACTION: rash    History   Social History  . Marital Status: Married    Spouse Name: N/A    Number of Children: N/A  . Years of Education: N/A   Occupational History  . Not on file.   Social History Main Topics  . Smoking status: Never Smoker   . Smokeless tobacco: Never Used  . Alcohol Use: No  . Drug Use: No  . Sexually Active: Yes   Other Topics Concern  . Not on file   Social History Narrative  . No narrative on file    Family History  Problem Relation Age of Onset  . Heart disease Maternal Grandfather   . Hodgkin's lymphoma Brother   . Hypertension Mother   . Heart disease Father   . Diabetes Maternal Grandmother   . Cancer Paternal Grandfather   . Heart disease Paternal Grandfather     Physical Exam: Blood pressure 130/70, pulse 88, temperature 97.4 F (36.3 C), temperature source Oral, resp. rate 16, height 5' 9.5" (1.765 m), weight 190 lb 3.2 oz (86.274 kg), SpO2 96.00%.  General: Well developed, well nourished, in no  acute distress. HEENT: Normocephalic, atraumatic. Conjunctiva pink, sclera non-icteric. Pupils 2 mm constricting to 1 mm, round, regular, and equally reactive to light and accomodation. EOMI. Internal auditory canal clear. TMs with good cone of light and without pathology. Nasal mucosa  pink. Nares are without discharge. No sinus tenderness. Oral mucosa pink. Dentition good. Pharynx without exudate.   Neck: Supple. Trachea midline. No thyromegaly. Full ROM. No lymphadenopathy. Lungs: Clear to auscultation bilaterally without wheezes, rales, or rhonchi. Breathing is of normal effort and unlabored. Cardiovascular: RRR with S1 S2. No murmurs, rubs, or gallops appreciated. Distal pulses 2+ symmetrically. No carotid or abdominal bruits Abdomen: Soft, non-tender, non-distended with normoactive bowel sounds. No hepatosplenomegaly or masses. No rebound/guarding. No CVA tenderness. Without hernias.  Rectal: No external hemorrhoids or fissures. Rectal vault without masses. Right upper lobe of prostate feels extra firm. Genitourinary:  circumcised male. No penile lesions. Testes descended bilaterally, and smooth without tenderness or masses.  Musculoskeletal: Full range of motion and 5/5 strength throughout. Without swelling, atrophy, tenderness, crepitus, or warmth. Extremities without clubbing, cyanosis, or edema. Calves supple. Skin: Warm and moist without erythema, ecchymosis, wounds, or rash. 1/2 cm irreg lesion at right posterior superior iliac crest Neuro: A+Ox3. CN II-XII grossly intact. Moves all extremities spontaneously. Full sensation throughout. Normal gait. DTR 2+ throughout upper and lower extremities. Finger to nose intact. Psych:  Responds to questions appropriately with a normal affect.   Studies: CBC, CMET, Lipid, PSA UA:   Assessment/Plan:  52 y.o. y/o  male here for CPE. He seems to be doing quite well on the Xarelto. Several issues uncovered:    1. Prominent right upper lobe of prostate  -->referral to Dr. Retta Diones  2. Palpitations at bedtime with sleep interruption as well---> referral to Dr. Salena Saner at [facility]  3. Small irregular right lumbar skin lesion --> referral to Dr. Jorja Loa Labs pending: Routine general medical examination at a health care facility  - Plan: POCT urinalysis dipstick, EKG 12-Lead, PSA, Lipid panel, CBC with Differential, Comprehensive metabolic panel, IFOBT POC (occult bld, rslt in office), TSH, EKG 12-Lead, EKG 12-Lead  BPH (benign prostatic hyperplasia) - Plan: Ambulatory referral to Urology  Prostate nodule - Plan: Ambulatory referral to Urology  Skin lesion - Plan: Ambulatory referral to Dermatology  Palpitations - Plan: Ambulatory referral to Cardiology   -  Signed, Elvina Sidle, MD 11/10/2012 4:22 PM

## 2012-11-10 NOTE — Patient Instructions (Addendum)
Health Maintenance, Males A healthy lifestyle and preventative care can promote health and wellness.  Maintain regular health, dental, and eye exams.  Eat a healthy diet. Foods like vegetables, fruits, whole grains, low-fat dairy products, and lean protein foods contain the nutrients you need without too many calories. Decrease your intake of foods high in solid fats, added sugars, and salt. Get information about a proper diet from your caregiver, if necessary.  Regular physical exercise is one of the most important things you can do for your health. Most adults should get at least 150 minutes of moderate-intensity exercise (any activity that increases your heart rate and causes you to sweat) each week. In addition, most adults need muscle-strengthening exercises on 2 or more days a week.   Maintain a healthy weight. The body mass index (BMI) is a screening tool to identify possible weight problems. It provides an estimate of body fat based on height and weight. Your caregiver can help determine your BMI, and can help you achieve or maintain a healthy weight. For adults 20 years and older:  A BMI below 18.5 is considered underweight.  A BMI of 18.5 to 24.9 is normal.  A BMI of 25 to 29.9 is considered overweight.  A BMI of 30 and above is considered obese.  Maintain normal blood lipids and cholesterol by exercising and minimizing your intake of saturated fat. Eat a balanced diet with plenty of fruits and vegetables. Blood tests for lipids and cholesterol should begin at age 20 and be repeated every 5 years. If your lipid or cholesterol levels are high, you are over 50, or you are a high risk for heart disease, you may need your cholesterol levels checked more frequently.Ongoing high lipid and cholesterol levels should be treated with medicines, if diet and exercise are not effective.  If you smoke, find out from your caregiver how to quit. If you do not use tobacco, do not start.  If you  choose to drink alcohol, do not exceed 2 drinks per day. One drink is considered to be 12 ounces (355 mL) of beer, 5 ounces (148 mL) of wine, or 1.5 ounces (44 mL) of liquor.  Avoid use of street drugs. Do not share needles with anyone. Ask for help if you need support or instructions about stopping the use of drugs.  High blood pressure causes heart disease and increases the risk of stroke. Blood pressure should be checked at least every 1 to 2 years. Ongoing high blood pressure should be treated with medicines if weight loss and exercise are not effective.  If you are 45 to 51 years old, ask your caregiver if you should take aspirin to prevent heart disease.  Diabetes screening involves taking a blood sample to check your fasting blood sugar level. This should be done once every 3 years, after age 45, if you are within normal weight and without risk factors for diabetes. Testing should be considered at a younger age or be carried out more frequently if you are overweight and have at least 1 risk factor for diabetes.  Colorectal cancer can be detected and often prevented. Most routine colorectal cancer screening begins at the age of 50 and continues through age 75. However, your caregiver may recommend screening at an earlier age if you have risk factors for colon cancer. On a yearly basis, your caregiver may provide home test kits to check for hidden blood in the stool. Use of a small camera at the end of a tube,   to directly examine the colon (sigmoidoscopy or colonoscopy), can detect the earliest forms of colorectal cancer. Talk to your caregiver about this at age 50, when routine screening begins. Direct examination of the colon should be repeated every 5 to 10 years through age 75, unless early forms of pre-cancerous polyps or small growths are found.  Hepatitis C blood testing is recommended for all people born from 1945 through 1965 and any individual with known risks for hepatitis C.  Healthy  men should no longer receive prostate-specific antigen (PSA) blood tests as part of routine cancer screening. Consult with your caregiver about prostate cancer screening.  Testicular cancer screening is not recommended for adolescents or adult males who have no symptoms. Screening includes self-exam, caregiver exam, and other screening tests. Consult with your caregiver about any symptoms you have or any concerns you have about testicular cancer.  Practice safe sex. Use condoms and avoid high-risk sexual practices to reduce the spread of sexually transmitted infections (STIs).  Use sunscreen with a sun protection factor (SPF) of 30 or greater. Apply sunscreen liberally and repeatedly throughout the day. You should seek shade when your shadow is shorter than you. Protect yourself by wearing long sleeves, pants, a wide-brimmed hat, and sunglasses year round, whenever you are outdoors.  Notify your caregiver of new moles or changes in moles, especially if there is a change in shape or color. Also notify your caregiver if a mole is larger than the size of a pencil eraser.  A one-time screening for abdominal aortic aneurysm (AAA) and surgical repair of large AAAs by sound wave imaging (ultrasonography) is recommended for ages 65 to 75 years who are current or former smokers.  Stay current with your immunizations. Document Released: 11/30/2007 Document Revised: 08/26/2011 Document Reviewed: 10/29/2010 [employer] Patient Information 2014 [employer].  

## 2012-11-11 LAB — LIPID PANEL
Cholesterol: 142 mg/dL (ref 0–200)
HDL: 32 mg/dL — ABNORMAL LOW (ref >39)
LDL Cholesterol: 84 mg/dL (ref 0–99)
Total CHOL/HDL Ratio: 4.4 Ratio
Triglycerides: 129 mg/dL (ref <150)
VLDL: 26 mg/dL (ref 0–40)

## 2012-11-11 LAB — COMPREHENSIVE METABOLIC PANEL
ALT: 24 U/L (ref 0–53)
AST: 17 U/L (ref 0–37)
Albumin: 4.3 g/dL (ref 3.5–5.2)
Alkaline Phosphatase: 58 U/L (ref 39–117)
BUN: 9 mg/dL (ref 6–23)
CO2: 29 mEq/L (ref 19–32)
Calcium: 8.9 mg/dL (ref 8.4–10.5)
Chloride: 102 mEq/L (ref 96–112)
Creat: 0.96 mg/dL (ref 0.50–1.35)
Glucose, Bld: 100 mg/dL — ABNORMAL HIGH (ref 70–99)
Potassium: 4.4 mEq/L (ref 3.5–5.3)
Sodium: 140 mEq/L (ref 135–145)
Total Bilirubin: 0.7 mg/dL (ref 0.3–1.2)
Total Protein: 6.4 g/dL (ref 6.0–8.3)

## 2012-11-11 LAB — CBC WITH DIFFERENTIAL/PLATELET
Basophils Absolute: 0 10*3/uL (ref 0.0–0.1)
Basophils Relative: 0 % (ref 0–1)
Eosinophils Absolute: 0.1 10*3/uL (ref 0.0–0.7)
Eosinophils Relative: 2 % (ref 0–5)
HCT: 44.7 % (ref 39.0–52.0)
Hemoglobin: 15.6 g/dL (ref 13.0–17.0)
Lymphocytes Relative: 31 % (ref 12–46)
Lymphs Abs: 2.2 10*3/uL (ref 0.7–4.0)
MCH: 30.5 pg (ref 26.0–34.0)
MCHC: 34.9 g/dL (ref 30.0–36.0)
MCV: 87.5 fL (ref 78.0–100.0)
Monocytes Absolute: 0.5 10*3/uL (ref 0.1–1.0)
Monocytes Relative: 7 % (ref 3–12)
Neutro Abs: 4.1 10*3/uL (ref 1.7–7.7)
Neutrophils Relative %: 60 % (ref 43–77)
Platelets: 212 10*3/uL (ref 150–400)
RBC: 5.11 MIL/uL (ref 4.22–5.81)
RDW: 12.9 % (ref 11.5–15.5)
WBC: 6.9 10*3/uL (ref 4.0–10.5)

## 2012-11-11 LAB — TSH: TSH: 1.799 u[IU]/mL (ref 0.350–4.500)

## 2012-11-11 LAB — PSA: PSA: 0.84 ng/mL (ref <=4.00)

## 2012-11-23 ENCOUNTER — Telehealth: Payer: Self-pay

## 2012-11-23 ENCOUNTER — Other Ambulatory Visit: Payer: Self-pay | Admitting: Family Medicine

## 2012-11-23 MED ORDER — RIVAROXABAN 20 MG PO TABS: 20.0000 mg | ORAL_TABLET | Freq: Every day | ORAL | Status: DC

## 2012-11-23 NOTE — Telephone Encounter (Signed)
PT STATES HE HAD SEEN DR Kenyon Ana AND FORGOT TO ASK HIM IF HE COULD START PRESCRIBING THE XARELTO HE USE TO GET FROM DR Sherene Sires AT [facility] PLEASE CALL [phone]    [facility] IN [place]

## 2012-12-08 ENCOUNTER — Ambulatory Visit: Payer: BC Managed Care – PPO | Admitting: Cardiovascular Disease

## 2012-12-10 ENCOUNTER — Telehealth: Payer: Self-pay

## 2012-12-10 NOTE — Telephone Encounter (Signed)
Pt states the last time he saw dr Milus Glazier they talked about him taking over the care of his xaretto 20mg  medication instead of dr wert .  It is now getting close to when he is going to need this refilled and he wanted to confirm with  Dr Milus Glazier if he was still going to do this .  Best number [phone]

## 2012-12-11 MED ORDER — RIVAROXABAN 20 MG PO TABS: 20.0000 mg | ORAL_TABLET | Freq: Every day | ORAL | Status: DC

## 2012-12-11 NOTE — Telephone Encounter (Signed)
Will do!

## 2012-12-11 NOTE — Telephone Encounter (Signed)
Thank you patient advised.

## 2012-12-31 ENCOUNTER — Encounter: Payer: Self-pay | Admitting: Cardiovascular Disease

## 2012-12-31 ENCOUNTER — Ambulatory Visit (INDEPENDENT_AMBULATORY_CARE_PROVIDER_SITE_OTHER): Payer: BC Managed Care – PPO | Admitting: Cardiovascular Disease

## 2012-12-31 VITALS — BP 124/86 | HR 69 | Ht 70.5 in | Wt 188.4 lb

## 2012-12-31 DIAGNOSIS — R002 Palpitations: Secondary | ICD-10-CM | POA: Insufficient documentation

## 2012-12-31 DIAGNOSIS — Z86718 Personal history of other venous thrombosis and embolism: Secondary | ICD-10-CM

## 2012-12-31 DIAGNOSIS — I2782 Chronic pulmonary embolism: Secondary | ICD-10-CM

## 2012-12-31 HISTORY — DX: Palpitations: R00.2

## 2012-12-31 NOTE — Assessment & Plan Note (Signed)
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 I recommend that he stay on lifelong anticoagulation. It is possible that his cardiac symptoms are related to residual cor pulmonale, may be due to pulmonary hypertension. Have recommended that he have an echocardiogram.

## 2012-12-31 NOTE — Assessment & Plan Note (Signed)
He seems to be describing isolated PACs and/or PVCs. These happen infrequently and we may not catch them on routine monitoring. Otherwise they are not causing any symptoms. We discussed the fact that it was probably more important to establish whether or not there is any structural cardiac abnormality (such as right heart strain second pulmonary hypertension due to previous problem of events) rather than clearly identified the rhythm abnormality. Even if he is having brief spells of atrial fibrillation, he is already on (lifelong) anticoagulant therapy and identifying this would not change management. Have her come in that he have an echocardiogram. He has normal right and left ventricular systolic function, normal chamber sizes, normal pulmonary artery pressure and no significant valvular abnormalities I do not think his palpitations need any specific therapy.

## 2012-12-31 NOTE — Patient Instructions (Signed)
Your physician has requested that you have an echocardiogram. Echocardiography is a painless test that uses sound waves to create images of your heart. It provides your doctor with information about the size and shape of your heart and how well your heart's chambers and valves are working. This procedure takes approximately one hour. There are no restrictions for this procedure.   

## 2012-12-31 NOTE — Progress Notes (Signed)
Patient ID: Andrew Gilbert, male   DOB: 13-Aug-1960, 52 y.o.   MRN: [id number]     Reason for office visit Palpitations  The patient is referred today for infrequent episodes of irregular heartbeat. He notices this mostly when he is lying on his left side in bed trying to sleep at night. The episodes only occur about 3-4 times a month and fairly brief. It sounds like he is describing isolated premature beats rather than a sustained arrhythmia. This does not associate shortness of breath, dizziness, syncope or chest pain. He does not have a history of structural heart disease and it sounds that he had a normal echocardiogram in 2011 when he had an episode of acute pulmonary embolism.  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 The initial episode of pulmonary embolism was felt to be secondary to long transatlantic flights. 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 He is now on lifelong anticoagulation with Xarelto. He has not had any recurrent embolic events and does not have bleeding problems. He is compliant with the medication. He denies exertional dyspnea when he walks on the track at work on his lunch break. He does not engage in intense physical activity. He denies chest pain. He does not have a history of syncope or near syncope.    Allergies  Allergen Reactions  . Penicillins Hives and Other (See Comments)    Swelling in face and neck    Current Outpatient Prescriptions  Medication Sig Dispense Refill  . Rivaroxaban (XARELTO) 20 MG TABS Take 1 tablet (20 mg total) by mouth daily.  30 tablet  11   No current facility-administered medications for this visit.    Past Medical History  Diagnosis Date  . Pulmonary embolism   . Phlebitis   . Allergy     No past surgical history on file.  Family History  Problem Relation Age of Onset  . Heart disease Maternal Grandfather   .  Hodgkin's lymphoma Brother   . Hypertension Mother   . Heart disease Father   . Heart failure Father 46    Congestive Heart Failure  . Diabetes Maternal Grandmother   . Cancer Paternal Grandfather   . Heart disease Paternal Grandfather     History   Social History  . Marital Status: Married    Spouse Name: N/A    Number of Children: N/A  . Years of Education: N/A   Occupational History  . Not on file.   Social History Main Topics  . Smoking status: Never Smoker   . Smokeless tobacco: Never Used  . Alcohol Use: No  . Drug Use: No  . Sexually Active: Yes   Other Topics Concern  . Not on file   Social History Narrative  . No narrative on file    Review of systems: The patient specifically denies any chest pain at rest or with exertion, dyspnea at rest or with exertion, orthopnea, paroxysmal nocturnal dyspnea, syncope, focal neurological deficits, intermittent claudication, lower extremity edema, unexplained weight gain, cough, hemoptysis or wheezing.  The patient also denies abdominal pain, nausea, vomiting, dysphagia, diarrhea, constipation, polyuria, polydipsia, dysuria, hematuria, frequency, urgency, abnormal bleeding or bruising, fever, chills, unexpected weight changes, mood swings, change in skin or hair texture, change in voice quality, auditory or visual problems, allergic reactions or rashes, new musculoskeletal complaints other than usual "aches and pains".   PHYSICAL EXAM BP  124/86  Pulse 69  Ht 5' 10.5" (1.791 m)  Wt 188 lb 6.4 oz (85.458 kg)  BMI 26.64 kg/m2  General: Alert, oriented x3, no distress Head: no evidence of trauma, PERRL, EOMI, no exophtalmos or lid lag, no myxedema, no xanthelasma; normal ears, nose and oropharynx Neck: normal jugular venous pulsations and no hepatojugular reflux; brisk carotid pulses without delay and no carotid bruits Chest: clear to auscultation, no signs of consolidation by percussion or palpation, normal fremitus,  symmetrical and full respiratory excursions Cardiovascular: normal position and quality of the apical impulse, regular rhythm, normal first and second heart sounds, no murmurs, rubs or gallops Abdomen: no tenderness or distention, no masses by palpation, no abnormal pulsatility or arterial bruits, normal bowel sounds, no hepatosplenomegaly Extremities: no clubbing, cyanosis or edema; 2+ radial, ulnar and brachial pulses bilaterally; 2+ right femoral, posterior tibial and dorsalis pedis pulses; 2+ left femoral, posterior tibial and dorsalis pedis pulses; no subclavian or femoral bruits Neurological: grossly nonfocal   EKG: Sinus rhythm otherwise normal  Lipid Panel     Component Value Date/Time   CHOL 142 11/10/2012 0743   TRIG 129 11/10/2012 0743   HDL 32* 11/10/2012 0743   CHOLHDL 4.4 11/10/2012 0743   VLDL 26 11/10/2012 0743   LDLCALC 84 11/10/2012 0743    BMET    Component Value Date/Time   NA 140 11/10/2012 0743   K 4.4 11/10/2012 0743   CL 102 11/10/2012 0743   CO2 29 11/10/2012 0743   GLUCOSE 100* 11/10/2012 0743   BUN 9 11/10/2012 0743   CREATININE 0.96 11/10/2012 0743   CREATININE 1.04 12/21/2009 0925   CALCIUM 8.9 11/10/2012 0743   GFRNONAA >60 12/21/2009 0925   GFRAA  Value: >60        The eGFR has been calculated using the MDRD equation. This calculation has not been validated in all clinical situations. eGFR's persistently <60 mL/min signify possible Chronic Kidney Disease. 12/21/2009 0925     ASSESSMENT AND PLAN Palpitations He seems to be describing isolated PACs and/or PVCs. These happen infrequently and we may not catch them on routine monitoring. Otherwise they are not causing any symptoms. We discussed the fact that it was probably more important to establish whether or not there is any structural cardiac abnormality (such as right heart strain second pulmonary hypertension due to previous problem of events) rather than clearly identified the rhythm abnormality. Even if he is  having brief spells of atrial fibrillation, he is already on (lifelong) anticoagulant therapy and identifying this would not change management. Have her come in that he have an echocardiogram. He has normal right and left ventricular systolic function, normal chamber sizes, normal pulmonary artery pressure and no significant valvular abnormalities I do not think his palpitations need any specific therapy.  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 I recommend that he stay on lifelong anticoagulation. It is possible that his cardiac symptoms are related to residual cor pulmonale, may be due to pulmonary hypertension. Have recommended that he have an echocardiogram.  Orders Placed This Encounter  Procedures  . EKG 12-Lead  . 2D Echocardiogram with contrast   No orders of the defined types were placed in this encounter.    Junious Silk, MD, [facility] [phone] office [phone] pager

## 2013-01-05 ENCOUNTER — Ambulatory Visit (HOSPITAL_COMMUNITY)
Admission: RE | Admit: 2013-01-05 | Discharge: 2013-01-05 | Disposition: A | Discharge status: Home / Self Care | Payer: BC Managed Care – PPO | Source: Ambulatory Visit | Attending: Cardiology | Admitting: Cardiology

## 2013-01-05 DIAGNOSIS — I2782 Chronic pulmonary embolism: Secondary | ICD-10-CM

## 2013-01-05 DIAGNOSIS — I10 Essential (primary) hypertension: Secondary | ICD-10-CM | POA: Insufficient documentation

## 2013-01-05 DIAGNOSIS — I2699 Other pulmonary embolism without acute cor pulmonale: Secondary | ICD-10-CM | POA: Insufficient documentation

## 2013-01-05 DIAGNOSIS — I82409 Acute embolism and thrombosis of unspecified deep veins of unspecified lower extremity: Secondary | ICD-10-CM | POA: Insufficient documentation

## 2013-01-05 NOTE — Progress Notes (Signed)
2D Echo Performed 01/05/2013    Espen Bethel, RCS  

## 2013-03-21 IMAGING — CT CT ANGIO CHEST
3 of 8 series · 17 of 36 positions shown · IV contrast ([ID] [id number])
Comparison: Chest x-ray of 03/31/2010 and the c t chest of
12/20/2009

CLINICAL DATA: Right flank pain after eating, superficial
thrombosis of the lower leg recently, history of prior pulmonary
emboli.

CT ANGIOGRAPHY CHEST
TECHNIQUE: Multidetector CT imaging of the chest using the
standard protocol during bolus administration of intravenous
contrast. Multiplanar reconstructed images including MIPs were
obtained and reviewed to evaluate the vascular anatomy.
Contrast: 125mL OMNIPAQUE IOHEXOL 350 MG/ML SOLN

[Series 5: pe thin 1.25 · axial · 0.87mm/px · z∈[-247,-30]mm · 8 of 281 slices shown (1 of 2)]
[im 32/281  lung]
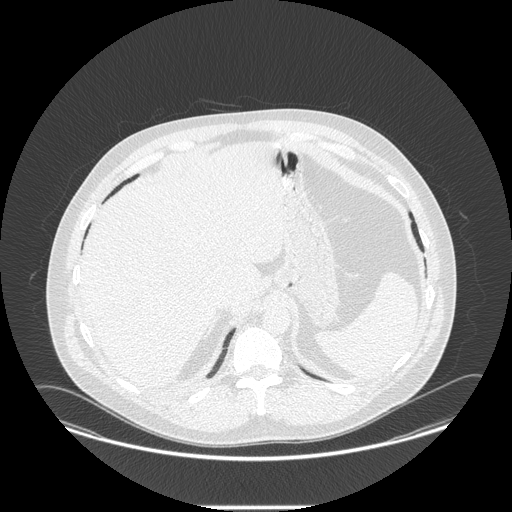
[im 63/281  mediastinal]
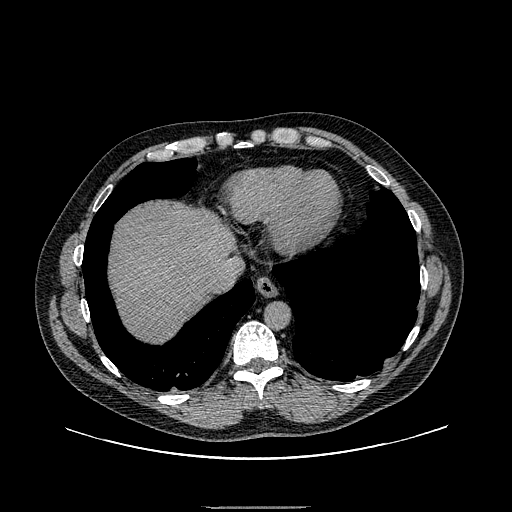
[im 94/281  lung]
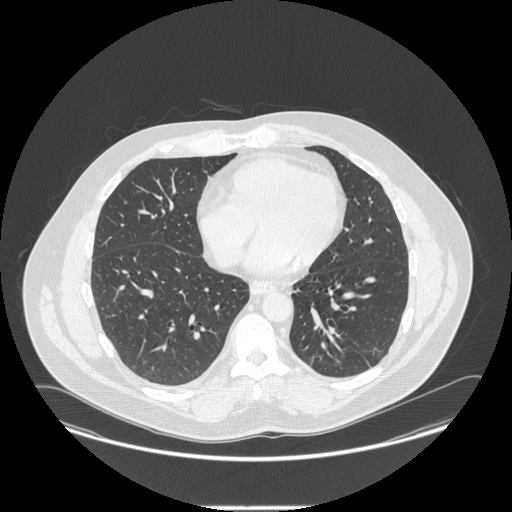
[im 125/281  mediastinal]
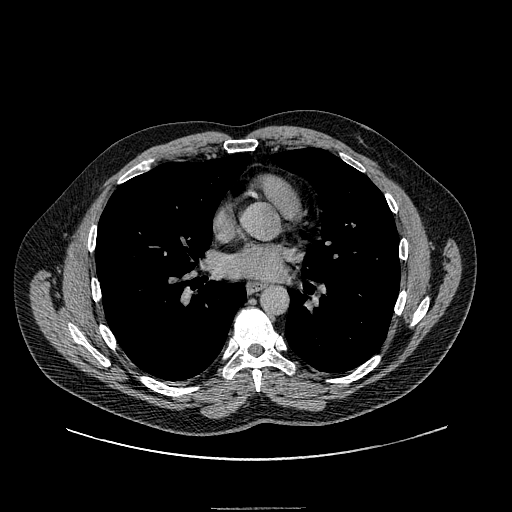
[im 156/281  lung]
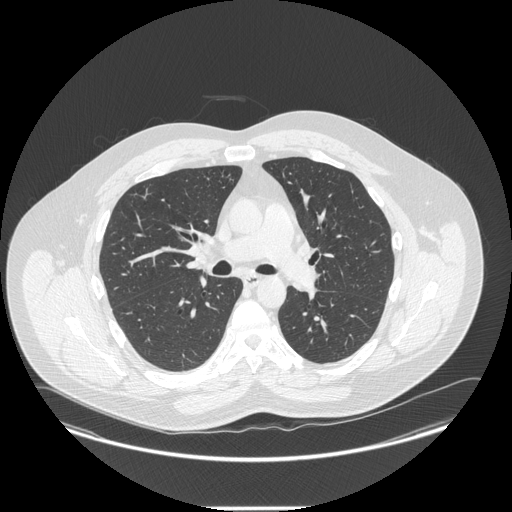
[im 187/281  mediastinal]
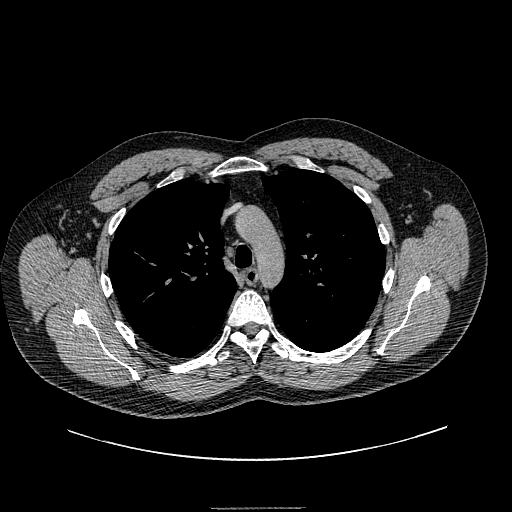
[im 218/281  lung]
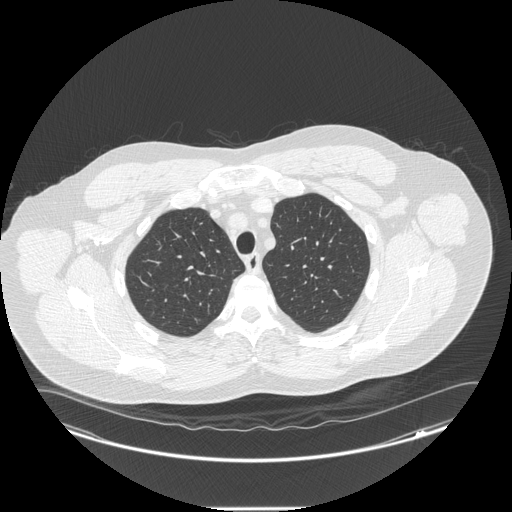
[im 249/281  mediastinal]
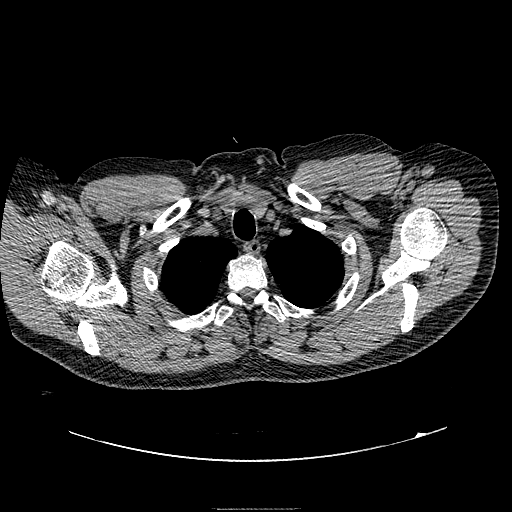

[Series 9: pe thin 1.25 · axial · 0.86mm/px · z∈[-242,-28]mm · 8 of 276 slices shown (2 of 2)]
[im 31/276  lung]
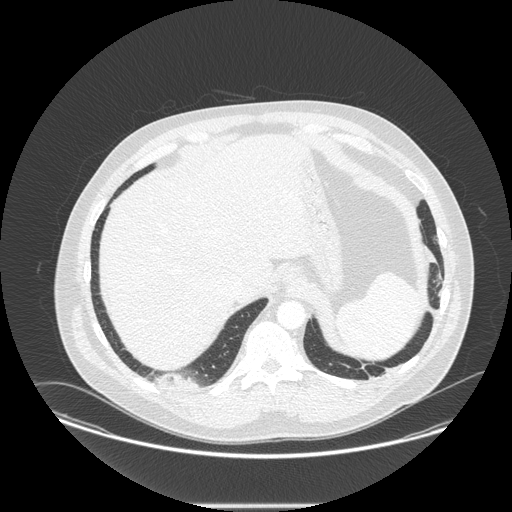
[im 62/276  lung]
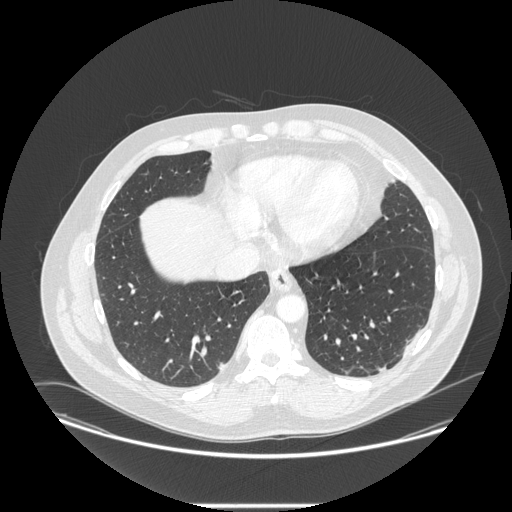
[im 92/276  lung]
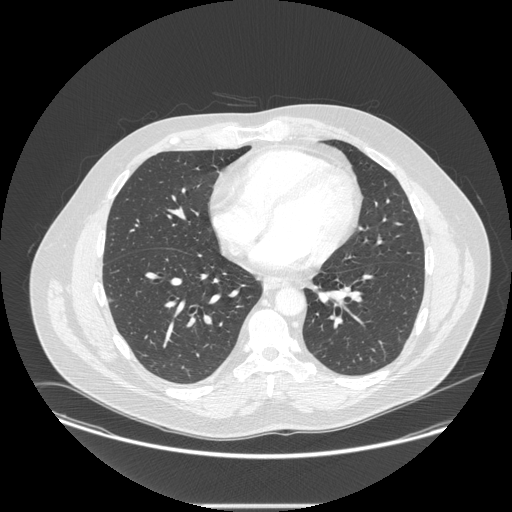
[im 123/276  lung]
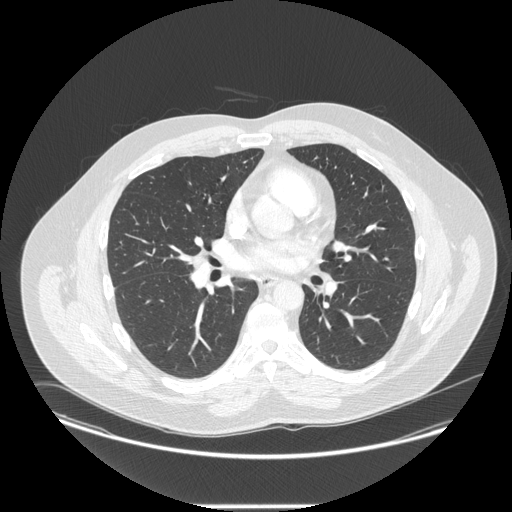
[im 153/276  lung]
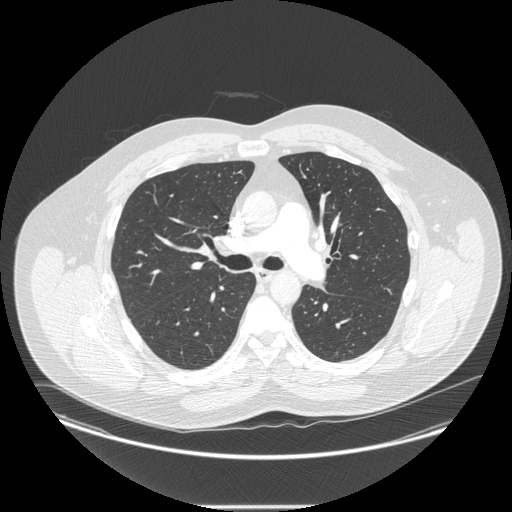
[im 184/276  lung]
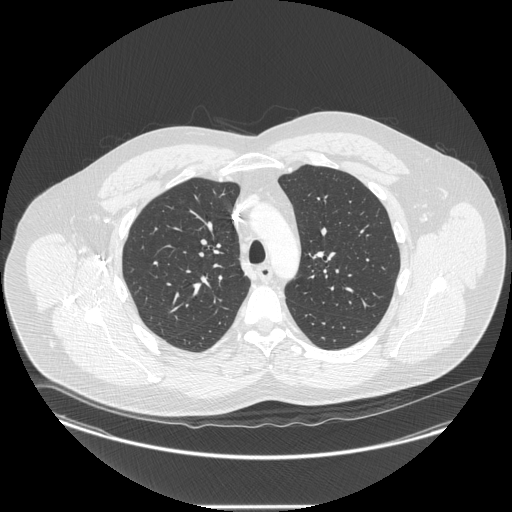
[im 214/276  lung]
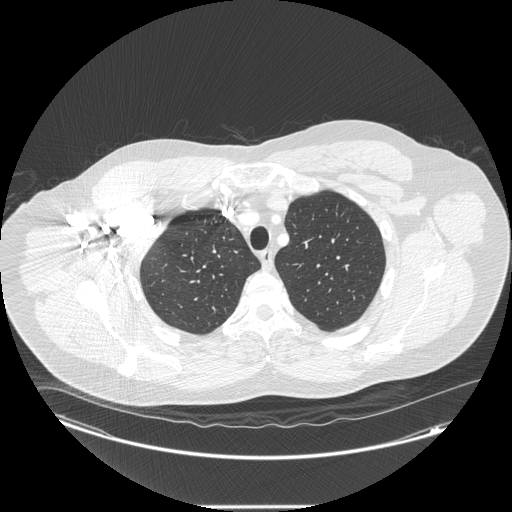
[im 245/276  lung]
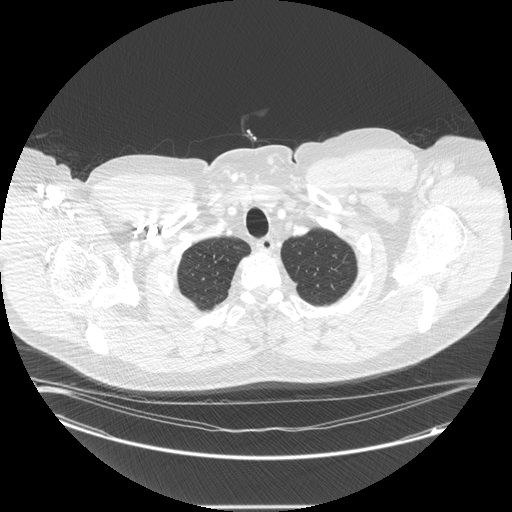

[Series 108: cor · coronal · 0.86mm/px · 1 of 120 slices shown]
[im 60/120  mediastinal]
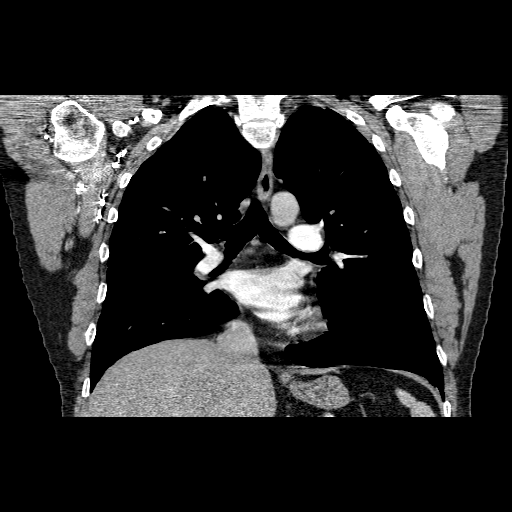

[17 of 36 positions shown; findings below may reference images not displayed]

FINDINGS: The pulmonary arteries opacify well.  There are small
pulmonary emboli within branches of the pulmonary arteries to the
right lower lobe and to the left lower lobe.  The largest is in a
branch to the right lower lobe and a bifurcation.  No large central
emboli are seen.  Some stranding in the proximal right lower lobe
pulmonary artery may be due to prior pulmonary emboli and fibrosis.

The thoracic aorta opacifies with no acute abnormality and the
origins of the great vessels are patent.  No mediastinal or hilar
adenopathy is seen.  Faint coronary artery calcification is
present.

On the lung window images, a small 3 mm noncalcified nodule is
present in the left upper lobe which may have been present
previously but not as well seen.  However, within the right lower
lobe there is parenchymal opacity present deep in the posterior
right lung sulcus.  This may represent an area of developing
pulmonary infarction.  Mild atelectasis is noted in the posterior
left lower lobe.  No pleural effusion is seen.  No abnormality of
the upper abdomen is seen.  The thyroid gland is unremarkable.  No
bony abnormality is noted.
IMPRESSION: 1.  There are pulmonary emboli in secondary branches of the right
lower lobe and left lower lobe pulmonary arteries.
2.  Parenchymal opacity deep in the posterior right lower lobe lung
sulcus may represent an area of developing pulmonary infarction.
3.  3 mm noncalcified nodule in the left upper lobe.  If the
patient is considered high risk, follow-up CT of the chest may be
warranted in 6-12 months.

Critical Value/emergent results were called by telephone at the
time of interpretation on 05/06/2012 at [DATE] a.m. to Dr.Mar[place]uk,
who verbally acknowledged these results.

## 2013-06-06 ENCOUNTER — Other Ambulatory Visit: Payer: Self-pay | Admitting: Internal Medicine

## 2014-03-16 ENCOUNTER — Other Ambulatory Visit: Payer: Self-pay | Admitting: Family Medicine

## 2014-04-01 ENCOUNTER — Telehealth: Payer: Self-pay

## 2014-04-01 NOTE — Telephone Encounter (Signed)
Pt request a call back to schedule an appt- Pt needs an office visit for refills cardiac medication.

## 2014-04-01 NOTE — Telephone Encounter (Signed)
PATIENT STATES WHEN HE TRIED TO GET A REFILL ON HIS XARELTO 20 MG, THE PHARMACY TOLD HIM TO CALL OUR OFFICE. HE IS NOT SURE WHY? HE SAID HIS BCBS INSURANCE IS THRU A DIFFERENT PLAIN NOW THEN IT WAS BEFORE. HE NEEDS TO GET A PRE-AUTH. AND THINKS THAT IS WHY HE NEEDED TO CALL us. BEST PHONE [phone] (CELL)  [place], [place].  [place]

## 2014-04-05 ENCOUNTER — Ambulatory Visit (INDEPENDENT_AMBULATORY_CARE_PROVIDER_SITE_OTHER): Payer: BC Managed Care – PPO | Admitting: Family Medicine

## 2014-04-05 VITALS — BP 126/72 | HR 57 | Temp 98.2°F | Resp 17 | Ht 70.0 in | Wt 200.0 lb

## 2014-04-05 DIAGNOSIS — I2699 Other pulmonary embolism without acute cor pulmonale: Secondary | ICD-10-CM

## 2014-04-05 MED ORDER — RIVAROXABAN 20 MG PO TABS: 20.0000 mg | ORAL_TABLET | Freq: Every day | ORAL | Status: DC

## 2014-04-05 NOTE — Patient Instructions (Signed)
Pulmonary Embolism A pulmonary (lung) embolism (PE) is a blood clot that has traveled to the lung and results in a blockage of blood flow in the affected lung. Most clots come from deep veins in the legs or pelvis. PE is a dangerous and potentially life-threatening condition that can be treated if identified. CAUSES Blood clots form in a vein for different reasons. Usually several things cause blood clots. They include:  The flow of blood slows down.  The inside of the vein is damaged in some way.  The person has a condition that makes the blood clot more easily. RISK FACTORS Some people are more likely than others to develop PE. Risk factors include:   Smoking.  Being overweight (obese).  Sitting or lying still for a long time. This includes long-distance travel, paralysis, or recovery from an illness or surgery. Other factors that increase risk are:   Older age, especially over 75 years of age.  Having a family history of blood clots or if you have already had a blood clot.  Having major or lengthy surgery. This is especially true for surgery on the hip, knee, or belly (abdomen). Hip surgery is particularly high risk.  Having a long, thin tube (catheter) placed inside a vein during a medical procedure.  Breaking a hip or leg.  Having cancer or cancer treatment.  Medicines containing the male hormone estrogen. This includes birth control pills and hormone replacement therapy.  Other circulation or heart problems.  Pregnancy and childbirth.  Hormone changes make the blood clot more easily during pregnancy.  The fetus puts pressure on the veins of the pelvis.  There is a risk of injury to veins during delivery or a caesarean delivery. The risk is highest just after childbirth.  PREVENTION   Exercise the legs regularly. Take a brisk 30 minute walk every day.  Maintain a weight that is appropriate for your height.  Avoid sitting or lying in bed for long periods of  time without moving your legs.  Women, particularly those over the age of 35 years, should consider the risks and benefits of taking estrogen medicines, including birth control pills.  Do not smoke, especially if you take estrogen medicines.  Long-distance travel can increase your risk. You should exercise your legs by walking or pumping the muscles every hour.  Many of the risk factors above relate to situations that exist with hospitalization, either for illness, injury, or elective surgery. Prevention may include medical and nonmedical measures.   Your health care provider will assess you for the need for venous thromboembolism prevention when you are admitted to the hospital. If you are having surgery, your surgeon will assess you the day of or day after surgery.  SYMPTOMS  The symptoms of a PE usually start suddenly and include:  Shortness of breath.  Coughing.  Coughing up blood or blood-tinged mucus.  Chest pain. Pain is often worse with deep breaths.  Rapid heartbeat. DIAGNOSIS  If a PE is suspected, your health care provider will take a medical history and perform a physical exam. Other tests that may be required include:  Blood tests, such as studies of the clotting properties of your blood.  Imaging tests, such as ultrasound, CT, MRI, and other tests to see if you have clots in your legs or lungs.  An electrocardiogram. This can look for heart strain from blood clots in the lungs. TREATMENT   The most common treatment for a PE is blood thinning (anticoagulant) medicine, which reduces   the blood's tendency to clot. Anticoagulants can stop new blood clots from forming and old clots from growing. They cannot dissolve existing clots. Your body does this by itself over time. Anticoagulants can be given by mouth, through an intravenous (IV) tube, or by injection. Your health care provider will determine the best program for you.  Less commonly, clot-dissolving medicines  (thrombolytics) are used to dissolve a PE. They carry a high risk of bleeding, so they are used mainly in severe cases.  Very rarely, a blood clot in the leg needs to be removed surgically.  If you are unable to take anticoagulants, your health care provider may arrange for you to have a filter placed in a main vein in your abdomen. This filter prevents clots from traveling to your lungs. HOME CARE INSTRUCTIONS   Take all medicines as directed by your health care provider.  Learn as much as you can about DVT.  Wear a medical alert bracelet or carry a medical alert card.  Ask your health care provider how soon you can go back to normal activities. It is important to stay active to prevent blood clots. If you are on anticoagulant medicine, avoid contact sports.  It is very important to exercise. This is especially important while traveling, sitting, or standing for long periods of time. Exercise your legs by walking or by tightening and relaxing your leg muscles regularly. Take frequent walks.  You may need to wear compression stockings. These are tight elastic stockings that apply pressure to the lower legs. This pressure can help keep the blood in the legs from clotting. Taking Warfarin Warfarin is a daily medicine that is taken by mouth. Your health care provider will advise you on the length of treatment (usually 3-6 months, sometimes lifelong). If you take warfarin:  Understand how to take warfarin and foods that can affect how warfarin works in your body.  Too much and too little warfarin are both dangerous. Too much warfarin increases the risk of bleeding. Too little warfarin continues to allow the risk for blood clots. Warfarin and Regular Blood Testing While taking warfarin, you will need to have regular blood tests to measure your blood clotting time. These blood tests usually include both the prothrombin time (PT) and international normalized ratio (INR) tests. The PT and INR  results allow your health care provider to adjust your dose of warfarin. It is very important that you have your PT and INR tested as often as directed by your health care provider.  Warfarin and Your Diet Avoid major changes in your diet, or notify your health care provider before changing your diet. Arrange a visit with a registered dietitian to answer your questions. Many foods, especially foods high in vitamin K, can interfere with warfarin and affect the PT and INR results. You should eat a consistent amount of foods high in vitamin K. Foods high in vitamin K include:   Spinach, kale, broccoli, cabbage, collard and turnip greens, Brussels sprouts, peas, cauliflower, seaweed, and parsley.  Beef and pork liver.  Green tea.  Soybean oil. Warfarin with Other Medicines Many medicines can interfere with warfarin and affect the PT and INR results. You must:  Tell your health care provider about any and all medicines, vitamins, and supplements you take, including aspirin and other over-the-counter anti-inflammatory medicines. Be especially cautious with aspirin and anti-inflammatory medicines. Ask your health care provider before taking these.  Do not take or discontinue any prescribed or over-the-counter medicine except on the advice   of your health care provider or pharmacist. Warfarin Side Effects Warfarin can have side effects, such as easy bruising and difficulty stopping bleeding. Ask your health care provider or pharmacist about other side effects of warfarin. You will need to:  Hold pressure over cuts for longer than usual.  Notify your dentist and other health care providers that you are taking warfarin before you undergo any procedures where bleeding may occur. Warfarin with Alcohol and Tobacco   Drinking alcohol frequently can increase the effect of warfarin, leading to excess bleeding. It is best to avoid alcoholic drinks or consume only very small amounts while taking warfarin.  Notify your health care provider if you change your alcohol intake.  Do not use any tobacco products including cigarettes, chewing tobacco, or electronic cigarettes. If you smoke, quit. Ask your health care provider for help with quitting smoking. Alternative Medicines to Warfarin: Factor Xa Inhibitor Medicines  These blood thinning medicines are taken by mouth, usually for several weeks or longer. It is important to take the medicine every single day, at the same time each day.  There are no regular blood tests required when using these medicines.  There are fewer food and drug interactions than with warfarin.  The side effects of this class of medicine is similar to that of warfarin, including excessive bruising or bleeding. Ask your health care provider or pharmacist about other potential side effects. SEEK MEDICAL CARE IF:   You notice a rapid heartbeat.  You feel weaker or more tired than usual.  You feel faint.  You notice increased bruising.  Your symptoms are not getting better in the time expected.  You are having side effects of medicine. SEEK IMMEDIATE MEDICAL CARE IF:   You have chest pain.  You have trouble breathing.  You have new or increased swelling or pain in one leg.  You cough up blood.  You notice blood in vomit, in a bowel movement, or in urine.  You have a fever. Symptoms of PE may represent a serious problem that is an emergency. Do not wait to see if the symptoms will go away. Get medical help right away. Call your local emergency services (911 in the [place]). Do not drive yourself to the hospital. Document Released: 05/31/2000 Document Revised: 10/18/2013 Document Reviewed: 06/14/2013 ExitCare Patient Information 2015 [employer]. This information is not intended to replace advice given to you by your health care provider. Make sure you discuss any questions you have with your health care provider.  

## 2014-04-05 NOTE — Progress Notes (Signed)
   Subjective:  This chart was scribed for Robyn Haber by Dellis Filbert, ED Scribe. The patient was seen in Exam room 5and the patient's care was started at 6:54 PM.   Patient ID: Andrew Gilbert, male    DOB: 1961/05/13, 53 y.o.   MRN: [id number]  HPI HPI Comments: Andrew Gilbert is a 53 y.o. male with a history of DVT and PE who presents to [facility] for a medication refill for Xarelto. Pt notes he has changed jobs and insurance. He works for [employer].   Patient Active Problem List   Diagnosis Date Noted  . Palpitations 12/31/2012  . DEEP VENOUS THROMBOPHLEBITIS 07/27/2010  . PULMONARY EMBOLISM, HX OF 01/09/2010  . NEPHROLITHIASIS, HX OF 01/09/2010   Past Medical History  Diagnosis Date  . Pulmonary embolism   . Phlebitis   . Allergy    No past surgical history on file. Allergies  Allergen Reactions  . Penicillins Hives and Other (See Comments)    Swelling in face and neck   Prior to Admission medications   Medication Sig Start Date End Date Taking? Authorizing Provider  rivaroxaban (XARELTO) 20 MG TABS tablet Take 1 tablet (20 mg total) by mouth daily. NO MORE REFILLS WITHOUT OFFICE VISIT - 2ND NOTICE 03/17/14  Yes Robyn Haber, MD   History   Social History  . Marital Status: Married    Spouse Name: N/A    Number of Children: N/A  . Years of Education: N/A   Occupational History  . Not on file.   Social History Main Topics  . Smoking status: Never Smoker   . Smokeless tobacco: Never Used  . Alcohol Use: No  . Drug Use: No  . Sexual Activity: Yes   Other Topics Concern  . Not on file   Social History Narrative  . No narrative on file     Review of Systems  Constitutional: Negative for fever and chills.  All other systems reviewed and are negative.      Objective:   Physical Exam  Nursing note and vitals reviewed. Constitutional: He is oriented to person, place, and time. He appears well-developed and well-nourished.  HENT:  Head:  Normocephalic and atraumatic.  Eyes: EOM are normal.  Neck: Normal range of motion.  Cardiovascular: Normal rate.   Pulmonary/Chest: Effort normal.  Musculoskeletal: Normal range of motion.  Neurological: He is alert and oriented to person, place, and time.  Skin: Skin is warm and dry.  Psychiatric: He has a normal mood and affect. His behavior is normal.  lungs clear  BP 126/72  Pulse 57  Temp(Src) 98.2 F (36.8 C) (Oral)  Resp 17  Ht 5\' 10"  (1.778 m)  Wt 200 lb (90.719 kg)  BMI 28.70 kg/m2  SpO2 99% Heart: 970 systolic flow murmur    Assessment & Plan:  I personally performed the services described in this documentation, which was scribed in my presence. The recorded information has been reviewed and is accurate.  Multiple pulmonary emboli - Plan: rivaroxaban (XARELTO) 20 MG TABS tablet  Signed, Robyn Haber, MD

## 2014-04-08 ENCOUNTER — Telehealth: Payer: Self-pay

## 2014-04-08 NOTE — Telephone Encounter (Signed)
LMVM for pt to CB to schedule an appointment.

## 2014-04-08 NOTE — Telephone Encounter (Signed)
Telephone call from patient stating that he had seen Dr. Joseph Art on Tuesday night to have his Xarelto refilled.  I told him that was fine and he no longer needed to schedule an appointment.

## 2014-04-11 ENCOUNTER — Telehealth: Payer: Self-pay

## 2014-04-11 NOTE — Telephone Encounter (Signed)
PA needed for Xarelto. Completed on phone and asked for urgent review. Pended - should hear w/in 24 hrs.

## 2014-04-14 NOTE — Telephone Encounter (Signed)
Called pharm to check if they are/were able to get Rx to go through. Pharm tried and it was covered so PA was approved. She will notify pt when ready.

## 2014-04-21 NOTE — Telephone Encounter (Signed)
Call to patient.  He states he has already scheduled an appointment for a cpe in December.

## 2014-06-02 ENCOUNTER — Encounter: Payer: BC Managed Care – PPO | Admitting: Family Medicine

## 2014-07-07 ENCOUNTER — Encounter: Payer: BC Managed Care – PPO | Admitting: Family Medicine

## 2015-04-26 ENCOUNTER — Other Ambulatory Visit: Payer: Self-pay | Admitting: Family Medicine

## 2017-03-17 DIAGNOSIS — I2109 ST elevation (STEMI) myocardial infarction involving other coronary artery of anterior wall: Secondary | ICD-10-CM

## 2017-03-17 HISTORY — DX: ST elevation (STEMI) myocardial infarction involving other coronary artery of anterior wall: I21.09

## 2017-04-06 DIAGNOSIS — Z9889 Other specified postprocedural states: Secondary | ICD-10-CM | POA: Diagnosis not present

## 2017-04-06 DIAGNOSIS — I2510 Atherosclerotic heart disease of native coronary artery without angina pectoris: Secondary | ICD-10-CM | POA: Diagnosis not present

## 2017-04-06 DIAGNOSIS — E876 Hypokalemia: Secondary | ICD-10-CM | POA: Diagnosis not present

## 2017-04-06 DIAGNOSIS — I472 Ventricular tachycardia: Secondary | ICD-10-CM | POA: Diagnosis not present

## 2017-04-06 DIAGNOSIS — I2119 ST elevation (STEMI) myocardial infarction involving other coronary artery of inferior wall: Secondary | ICD-10-CM | POA: Diagnosis not present

## 2017-04-06 DIAGNOSIS — R0602 Shortness of breath: Secondary | ICD-10-CM | POA: Diagnosis not present

## 2017-04-06 DIAGNOSIS — I25118 Atherosclerotic heart disease of native coronary artery with other forms of angina pectoris: Secondary | ICD-10-CM | POA: Diagnosis not present

## 2017-04-06 DIAGNOSIS — R079 Chest pain, unspecified: Secondary | ICD-10-CM | POA: Diagnosis not present

## 2017-04-06 DIAGNOSIS — I213 ST elevation (STEMI) myocardial infarction of unspecified site: Secondary | ICD-10-CM | POA: Diagnosis not present

## 2017-04-06 DIAGNOSIS — N179 Acute kidney failure, unspecified: Secondary | ICD-10-CM | POA: Diagnosis not present

## 2017-04-06 DIAGNOSIS — R739 Hyperglycemia, unspecified: Secondary | ICD-10-CM | POA: Diagnosis not present

## 2017-04-06 DIAGNOSIS — R9431 Abnormal electrocardiogram [ECG] [EKG]: Secondary | ICD-10-CM | POA: Diagnosis not present

## 2017-04-21 DIAGNOSIS — I2510 Atherosclerotic heart disease of native coronary artery without angina pectoris: Secondary | ICD-10-CM | POA: Diagnosis not present

## 2017-04-21 DIAGNOSIS — I255 Ischemic cardiomyopathy: Secondary | ICD-10-CM | POA: Diagnosis not present

## 2017-04-21 DIAGNOSIS — E785 Hyperlipidemia, unspecified: Secondary | ICD-10-CM | POA: Diagnosis not present

## 2017-06-04 DIAGNOSIS — I2510 Atherosclerotic heart disease of native coronary artery without angina pectoris: Secondary | ICD-10-CM | POA: Diagnosis not present

## 2017-06-04 DIAGNOSIS — E7800 Pure hypercholesterolemia, unspecified: Secondary | ICD-10-CM | POA: Diagnosis not present

## 2017-06-04 DIAGNOSIS — I252 Old myocardial infarction: Secondary | ICD-10-CM | POA: Diagnosis not present

## 2017-06-12 ENCOUNTER — Telehealth: Payer: Self-pay

## 2017-06-12 NOTE — Telephone Encounter (Signed)
SENT REFERRAL TO SCHEDULING 

## 2017-06-26 ENCOUNTER — Encounter: Payer: Self-pay | Admitting: Cardiology

## 2017-06-26 NOTE — Progress Notes (Signed)
Cardiology Office Note   Date:  07/01/2017   ID:  [name], DOB 1961-04-11, MRN [id number]  PCP:  Lois Huxley, PA  Cardiologist:   Peter Martinique, MD   Chief Complaint  Patient presents with  . Coronary Artery Disease      History of Present Illness: Andrew Gilbert is a 57 y.o. male who is seen at the request of Marilynne Drivers PA for evaluation of CAD. He was seen remotely in 2014 by Dr. Sallyanne Kuster for treatment of pulmonary embolism. This was felt to be triggered by prolonged overseas travel. No recurrence. He was admitted 04/06/17 to [facility] with acute lateral STEMI. He reports he had been doing a lot of yard work for his mother who lives in [place], [place]. Afterwards he was driving to lunch when he developed acute chest pain that felt like bad indigestion. He drove to ED in [facility] and was transferred by helicopter to [place]. He had emergent stenting of the ramus intermediate branch with a Xience Sierra DES. EDP was 28 mm Hg. Echo showed EF 45-50%. Anterolateral HK. Peak troponin 117.9. Chemistries, CBC, TSH, A1c normal. Cholesterol 182 with LDL 120. HDL 35. Triglycerides 136. He was DC on Plavix, ASA, metoprolol, lisinopril and lipitor. He is no longer on Xarelto.  Since then he has felt very well. He denies any recurrent chest pain, SOB, palpitations, dizziness, or edema. He has been eating a very healthy diet. Walks on a treadmill 3 days a week. Tolerating medication well. He is a nonsmoker.     Past Medical History:  Diagnosis Date  . Allergy   . CAD (coronary artery disease)   . Hypercholesterolemia   . Phlebitis   . Pulmonary embolism ([place])   . STEMI involving oth coronary artery of anterior wall ([place]) 03/2017    History reviewed. No pertinent surgical history.   Current Outpatient Medications  Medication Sig Dispense Refill  . atorvastatin (LIPITOR) 80 MG tablet Take 80 mg by mouth daily.    . clopidogrel (PLAVIX) 75 MG tablet Take 75  mg by mouth daily.    . [facility] ASPIRIN LOW DOSE 81 MG EC tablet Take 81 mg by mouth daily.  4  . lisinopril (PRINIVIL,ZESTRIL) 5 MG tablet Take 5 mg by mouth daily.    . metoprolol tartrate (LOPRESSOR) 25 MG tablet Take 25 mg by mouth 2 (two) times daily.    . pantoprazole (PROTONIX) 40 MG tablet Take 1 tablet by mouth daily.  11   No current facility-administered medications for this visit.     Allergies:   Penicillins    Social History:  The patient  reports that  has never smoked. he has never used smokeless tobacco. He reports that he does not drink alcohol or use drugs.   Family History:  The patient's family history includes Cancer in his paternal grandfather; Diabetes in his maternal grandmother; Heart disease in his father and paternal grandfather; Heart disease (age of onset: 45) in his maternal grandfather; Heart failure (age of onset: 11) in his father; Hodgkin's lymphoma in his brother; Hypertension in his mother.    ROS:  Please see the history of present illness.   Otherwise, review of systems are positive for none.   All other systems are reviewed and negative.    PHYSICAL EXAM: VS:  BP (!) 102/58   Pulse (!) 58   Ht 5' 10.5" (1.791 m)   Wt 193 lb 6.4 oz (87.7 kg)  BMI 27.36 kg/m  , BMI Body mass index is 27.36 kg/m. GEN: Well nourished, well developed, in no acute distress  HEENT: normal  Neck: no JVD, carotid bruits, or masses Cardiac: RRR; no murmurs, rubs, or gallops,no edema  Respiratory:  clear to auscultation bilaterally, normal work of breathing GI: soft, nontender, nondistended, + BS MS: no deformity or atrophy  Skin: warm and dry, no rash Neuro:  Strength and sensation are intact Psych: euthymic mood, full affect   EKG:  EKG is ordered today. The ekg ordered today demonstrates NSR rate 58. Normal Ecg. I have personally reviewed and interpreted this study.    Recent Labs: No results found for requested labs within last 8760 hours.    Lipid  Panel    Component Value Date/Time   CHOL 142 11/10/2012 0743   TRIG 129 11/10/2012 0743   HDL 32 (L) 11/10/2012 0743   CHOLHDL 4.4 11/10/2012 0743   VLDL 26 11/10/2012 0743   LDLCALC 84 11/10/2012 0743   Labs dated 04/09/17: WBC 11.4, otherwise CBC normal. CMET normal. TSH normal. A1c 5.5%. Cholesterol 182, triglycerides 136, HDL 35, LDL 120.    Wt Readings from Last 3 Encounters:  07/01/17 193 lb 6.4 oz (87.7 kg)  04/05/14 200 lb (90.7 kg)  12/31/12 188 lb 6.4 oz (85.5 kg)      Other studies Reviewed: Additional studies/ records that were reviewed today include: review of hospital records from [facility] as noted above.    ASSESSMENT AND PLAN:  1.  CAD s/p acute lateral STEMI in October 2018 treated with emergent stenting of the ramus intermediate vessel with DES. Now asymptomatic. On DAPT, beta blocker, ACEi, and statin. Tolerating meds well. Will continue DAPT for one year then ASA only. Encourage increased aerobic activity. Will request a copy of cath and Echo films from [place].  2. Hypercholesterolemia. Now on high dose statin. Check fasting lab today 3. Remote history of PE. Provoked by extended travel. No recurrence.    Current medicines are reviewed at length with the patient today.  The patient does not have concerns regarding medicines.  The following changes have been made:  no change  Labs/ tests ordered today include:   Orders Placed This Encounter  Procedures  . Basic metabolic panel  . Lipid Panel w/o Chol/HDL Ratio  . Hepatic function panel     Disposition:   FU with me in 6 months  Signed, Peter Martinique, MD  07/01/2017 10:23 AM    [facility] [address] [place], [place], [address] Phone [phone], Fax [phone]

## 2017-06-30 ENCOUNTER — Telehealth: Payer: Self-pay

## 2017-06-30 ENCOUNTER — Other Ambulatory Visit: Payer: Self-pay

## 2017-06-30 NOTE — Telephone Encounter (Signed)
Faxed records from [facility] (Marilynne Drivers, [place]) and [facility] to [place] for appt with Dr. Martinique 07/01/17. Confirmation received

## 2017-07-01 ENCOUNTER — Encounter: Payer: Self-pay | Admitting: Cardiology

## 2017-07-01 ENCOUNTER — Ambulatory Visit: Payer: 59 | Admitting: Cardiology

## 2017-07-01 VITALS — BP 102/58 | HR 58 | Ht 70.5 in | Wt 193.4 lb

## 2017-07-01 DIAGNOSIS — I2510 Atherosclerotic heart disease of native coronary artery without angina pectoris: Secondary | ICD-10-CM

## 2017-07-01 DIAGNOSIS — E7800 Pure hypercholesterolemia, unspecified: Secondary | ICD-10-CM

## 2017-07-01 DIAGNOSIS — Z86711 Personal history of pulmonary embolism: Secondary | ICD-10-CM | POA: Diagnosis not present

## 2017-07-01 LAB — LIPID PANEL W/O CHOL/HDL RATIO
Cholesterol, Total: 95 mg/dL — ABNORMAL LOW (ref 100–199)
HDL: 31 mg/dL — AB (ref >39)
LDL Calculated: 44 mg/dL (ref 0–99)
TRIGLYCERIDES: 98 mg/dL (ref 0–149)
VLDL CHOLESTEROL CAL: 20 mg/dL (ref 5–40)

## 2017-07-01 LAB — HEPATIC FUNCTION PANEL
ALBUMIN: 4.3 g/dL (ref 3.5–5.5)
ALK PHOS: 90 IU/L (ref 39–117)
ALT: 43 IU/L (ref 0–44)
AST: 23 IU/L (ref 0–40)
Bilirubin Total: 0.6 mg/dL (ref 0.0–1.2)
Bilirubin, Direct: 0.18 mg/dL (ref 0.00–0.40)
Total Protein: 6.5 g/dL (ref 6.0–8.5)

## 2017-07-01 LAB — BASIC METABOLIC PANEL
BUN/Creatinine Ratio: 9 (ref 9–20)
BUN: 9 mg/dL (ref 6–24)
CALCIUM: 9.4 mg/dL (ref 8.7–10.2)
CHLORIDE: 103 mmol/L (ref 96–106)
CO2: 22 mmol/L (ref 20–29)
Creatinine, Ser: 1.03 mg/dL (ref 0.76–1.27)
GFR calc non Af Amer: 81 mL/min/{1.73_m2} (ref >59)
GFR, EST AFRICAN AMERICAN: 94 mL/min/{1.73_m2} (ref >59)
Glucose: 83 mg/dL (ref 65–99)
POTASSIUM: 4.7 mmol/L (ref 3.5–5.2)
Sodium: 140 mmol/L (ref 134–144)

## 2017-07-01 NOTE — Patient Instructions (Signed)
We will check lab work today  Continue  Your current therapy  I will see you in 6 months

## 2017-07-01 NOTE — Addendum Note (Signed)
Addended by: Kathyrn Lass on: 07/01/2017 10:34 AM   Modules accepted: Orders

## 2017-10-27 ENCOUNTER — Other Ambulatory Visit: Payer: Self-pay | Admitting: Cardiology

## 2018-04-26 ENCOUNTER — Other Ambulatory Visit: Payer: Self-pay | Admitting: Cardiology

## 2018-05-11 ENCOUNTER — Other Ambulatory Visit: Payer: Self-pay | Admitting: Cardiology

## 2018-05-13 ENCOUNTER — Other Ambulatory Visit: Payer: Self-pay | Admitting: *Deleted

## 2018-05-13 MED ORDER — CLOPIDOGREL BISULFATE 75 MG PO TABS: 75.0000 mg | ORAL_TABLET | Freq: Every day | ORAL | 2 refills | Status: DC

## 2018-05-13 MED ORDER — PANTOPRAZOLE SODIUM 40 MG PO TBEC: 40.0000 mg | DELAYED_RELEASE_TABLET | Freq: Every day | ORAL | 2 refills | Status: DC

## 2018-07-02 ENCOUNTER — Encounter: Payer: Self-pay | Admitting: Cardiology

## 2018-07-16 NOTE — Progress Notes (Signed)
Cardiology Office Note   Date:  07/20/2018   ID:  SAYRE WITHERINGTON, DOB 1961-02-21, MRN [id number]  PCP:  Lois Huxley, PA  Cardiologist:   Peter Martinique, MD   Chief Complaint  Patient presents with  . Coronary Artery Disease      History of Present Illness: Andrew Gilbert is a 58 y.o. male who is seen for follow up  of CAD. He was seen remotely in 2014 by Dr. Sallyanne Kuster for treatment of pulmonary embolism. This was felt to be triggered by prolonged overseas travel. No recurrence. He was admitted 04/06/17 to [facility] with acute lateral STEMI. He reports he had been doing a lot of yard work for his mother who lives in [place], [place]. Afterwards he was driving to lunch when he developed acute chest pain that felt like bad indigestion. He drove to ED in [facility] and was transferred by helicopter to [place]. He had emergent stenting of the ramus intermediate branch with a Xience Sierra DES. EDP was 28 mm Hg. Echo showed EF 45-50%. Anterolateral HK. Peak troponin 117.9. Chemistries, CBC, TSH, A1c normal. Cholesterol 182 with LDL 120. HDL 35. Triglycerides 136. He was DC on Plavix, ASA, metoprolol, lisinopril and lipitor. He is no longer on Xarelto.   Since then he has felt very well. He denies any recurrent chest pain, SOB, palpitations, dizziness, or edema. He eats a healthy diet. He walks regularly and lifts dumbbells 3-4x/week.     Past Medical History:  Diagnosis Date  . Allergy   . CAD (coronary artery disease)   . Hypercholesterolemia   . Phlebitis   . Pulmonary embolism ([place])   . STEMI involving oth coronary artery of anterior wall ([place]) 03/2017    History reviewed. No pertinent surgical history.   Current Outpatient Medications  Medication Sig Dispense Refill  . atorvastatin (LIPITOR) 80 MG tablet Take 1 tablet (80 mg total) by mouth daily. 90 tablet 3  . CVS ASPIRIN LOW DOSE 81 MG EC tablet Take 81 mg by mouth daily.  4  . lisinopril  (PRINIVIL,ZESTRIL) 5 MG tablet Take 1 tablet (5 mg total) by mouth daily. KEEP OV. 90 tablet 3  . metoprolol succinate (TOPROL-XL) 50 MG 24 hr tablet Take 1 tablet (50 mg total) by mouth daily. Take with or immediately following a meal. 90 tablet 3   No current facility-administered medications for this visit.     Allergies:   Penicillins    Social History:  The patient  reports that he has never smoked. He has never used smokeless tobacco. He reports that he does not drink alcohol or use drugs.   Family History:  The patient's family history includes Cancer in his paternal grandfather; Diabetes in his maternal grandmother; Heart disease in his father and paternal grandfather; Heart disease (age of onset: 60) in his maternal grandfather; Heart failure (age of onset: 30) in his father; Hodgkin's lymphoma in his brother; Hypertension in his mother.    ROS:  Please see the history of present illness.   Otherwise, review of systems are positive for none.   All other systems are reviewed and negative.    PHYSICAL EXAM: VS:  BP 130/70   Pulse (!) 56   Ht 5\' 11"  (1.803 m)   Wt 188 lb (85.3 kg)   BMI 26.22 kg/m  , BMI Body mass index is 26.22 kg/m. GENERAL:  Well appearing WM in NAD HEENT:  PERRL, EOMI, sclera are clear. Oropharynx  is clear. NECK:  No jugular venous distention, carotid upstroke brisk and symmetric, no bruits, no thyromegaly or adenopathy LUNGS:  Clear to auscultation bilaterally CHEST:  Unremarkable HEART:  RRR,  PMI not displaced or sustained,S1 and S2 within normal limits, no S3, no S4: no clicks, no rubs, no murmurs ABD:  Soft, nontender. BS +, no masses or bruits. No hepatomegaly, no splenomegaly EXT:  2 + pulses throughout, no edema, no cyanosis no clubbing SKIN:  Warm and dry.  No rashes NEURO:  Alert and oriented x 3. Cranial nerves II through XII intact. PSYCH:  Cognitively intact     EKG:  EKG is ordered today. The ekg ordered today demonstrates NSR rate 56.  Normal Ecg. I have personally reviewed and interpreted this study.    Recent Labs: No results found for requested labs within last 8760 hours.    Lipid Panel    Component Value Date/Time   CHOL 95 (L) 07/01/2017 1107   TRIG 98 07/01/2017 1107   HDL 31 (L) 07/01/2017 1107   CHOLHDL 4.4 11/10/2012 0743   VLDL 26 11/10/2012 0743   LDLCALC 44 07/01/2017 1107   Labs dated 04/09/17: WBC 11.4, otherwise CBC normal. CMET normal. TSH normal. A1c 5.5%. Cholesterol 182, triglycerides 136, HDL 35, LDL 120.    Wt Readings from Last 3 Encounters:  07/20/18 188 lb (85.3 kg)  07/01/17 193 lb 6.4 oz (87.7 kg)  04/05/14 200 lb (90.7 kg)      Other studies Reviewed: Additional studies/ records that were reviewed today include: review of hospital records from [facility] as noted above.    ASSESSMENT AND PLAN:  1.  CAD s/p acute lateral STEMI in October 2018 treated with emergent stenting of the ramus intermediate vessel with DES. He is asymptomatic.  He may stop Plavix now. Continue beta blocker, ACEi, and statin. Will switch metoprolol to Toprol XL 50 mg daily.  2. Hypercholesterolemia. Now on high dose statin. He is due for follow up lab work with primary care.   3. Remote history of PE. Provoked by extended travel. No recurrence.    Current medicines are reviewed at length with the patient today.  The patient does not have concerns regarding medicines.  The following changes have been made:  no change  Labs/ tests ordered today include:   No orders of the defined types were placed in this encounter.    Disposition:   FU with me in 6 months  Signed, Peter Martinique, MD  07/20/2018 10:35 AM    [facility] [address] [place], [place], [address] Phone [phone], Fax [phone]

## 2018-07-20 ENCOUNTER — Encounter: Payer: Self-pay | Admitting: Cardiology

## 2018-07-20 ENCOUNTER — Ambulatory Visit: Payer: 59 | Admitting: Cardiology

## 2018-07-20 VITALS — BP 130/70 | HR 56 | Ht 71.0 in | Wt 188.0 lb

## 2018-07-20 DIAGNOSIS — E7800 Pure hypercholesterolemia, unspecified: Secondary | ICD-10-CM

## 2018-07-20 DIAGNOSIS — Z86711 Personal history of pulmonary embolism: Secondary | ICD-10-CM

## 2018-07-20 DIAGNOSIS — I2510 Atherosclerotic heart disease of native coronary artery without angina pectoris: Secondary | ICD-10-CM | POA: Diagnosis not present

## 2018-07-20 MED ORDER — ATORVASTATIN CALCIUM 80 MG PO TABS: 80.0000 mg | ORAL_TABLET | Freq: Every day | ORAL | 3 refills | Status: DC

## 2018-07-20 MED ORDER — LISINOPRIL 5 MG PO TABS: 5.0000 mg | ORAL_TABLET | Freq: Every day | ORAL | 3 refills | Status: DC

## 2018-07-20 MED ORDER — METOPROLOL SUCCINATE ER 50 MG PO TB24: 50.0000 mg | ORAL_TABLET | Freq: Every day | ORAL | 3 refills | Status: DC

## 2018-07-20 NOTE — Patient Instructions (Addendum)
Stop taking Plavix and Protonix  We will switch metoprolol to Toprol XL 50 mg daily  Follow up in 6 months.

## 2018-07-20 NOTE — Addendum Note (Signed)
Addended by: Diana Eves on: 07/20/2018 11:08 AM   Modules accepted: Orders

## 2019-07-27 ENCOUNTER — Other Ambulatory Visit: Payer: Self-pay | Admitting: Cardiology

## 2019-08-24 ENCOUNTER — Other Ambulatory Visit: Payer: Self-pay | Admitting: Cardiology

## 2019-09-25 ENCOUNTER — Other Ambulatory Visit: Payer: Self-pay | Admitting: Cardiology

## 2019-10-21 ENCOUNTER — Other Ambulatory Visit: Payer: Self-pay | Admitting: Cardiology

## 2019-10-29 ENCOUNTER — Other Ambulatory Visit: Payer: Self-pay | Admitting: Cardiology

## 2019-11-05 ENCOUNTER — Other Ambulatory Visit: Payer: Self-pay | Admitting: Cardiology

## 2019-11-11 ENCOUNTER — Other Ambulatory Visit: Payer: Self-pay | Admitting: Cardiology

## 2019-11-11 ENCOUNTER — Telehealth: Payer: Self-pay

## 2019-11-11 NOTE — Telephone Encounter (Signed)
Called patient left message on personal voice mail metoprolol refill sent to pharmacy.Advised to call back to schedule follow up appointment with Dr.Jordan.

## 2019-11-11 NOTE — Telephone Encounter (Signed)
Per secure chat with Jordan Likes LPN ok to give 1 month supply Toprol XL 50 mg as Dr Martinique does not have opening till July 2021. She will call and schedule pt for appt.

## 2019-11-11 NOTE — Telephone Encounter (Signed)
Andrew Gilbert is returning Andrew Gilbert's call stating he is not needing a refill on metoprolol, but we did go ahead and schedule an appointment with Dr. Martinique for 12/28/19 since he was overdue.

## 2019-11-11 NOTE — Telephone Encounter (Signed)
Returned call to patient left message on personal voice mail keep appointment already scheduled with Dr.Jordan 7/13 at 4:20 pm.

## 2019-12-13 ENCOUNTER — Other Ambulatory Visit: Payer: Self-pay | Admitting: Cardiology

## 2019-12-24 NOTE — Progress Notes (Signed)
Cardiology Office Note   Date:  12/28/2019   ID:  Andrew Gilbert, DOB [birth year], MRN [id number]  PCP:  Lois Huxley, PA  Cardiologist:   Remmi Armenteros Martinique, MD   Chief Complaint  Patient presents with  . Coronary Artery Disease      History of Present Illness: Andrew Gilbert is a 59 y.o. male who is seen for follow up  of CAD. He was seen remotely in 2014 by Dr. Sallyanne Kuster for treatment of pulmonary embolism. This was felt to be triggered by prolonged overseas travel. No recurrence. He was admitted 04/06/17 to [facility] with acute lateral STEMI. He reports he had been doing a lot of yard work for his mother who lives in [place], [place]. Afterwards he was driving to lunch when he developed acute chest pain that felt like bad indigestion. He drove to ED in [facility] and was transferred by helicopter to [place]. He had emergent stenting of the ramus intermediate branch with a Xience Sierra DES. EDP was 28 mm Hg. Echo showed EF 45-50%. Anterolateral HK. Peak troponin 117.9. Chemistries, CBC, TSH, A1c normal. Cholesterol 182 with LDL 120. HDL 35. Triglycerides 136. He was DC on Plavix, ASA, metoprolol, lisinopril and lipitor. He is no longer on Xarelto.   On follow up he is doing very well. Denies any chest pain, palpitations, dyspnea. No edema. 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 Not exercising as much as he would like. Gained 17 lbs during the pandemic.     Past Medical History:  Diagnosis Date  . Allergy   . CAD (coronary artery disease)   . Hypercholesterolemia   . Phlebitis   . Pulmonary embolism ([place])   . STEMI involving oth coronary artery of anterior wall ([place]) 03/2017    History reviewed. No pertinent surgical history.   Current Outpatient Medications  Medication Sig Dispense Refill  . atorvastatin (LIPITOR) 80 MG tablet TAKE 1 TABLET BY MOUTH EVERY DAY **MUST HAVE OFFICE VISIT FOR REFILLS** 30 tablet 0  . CVS ASPIRIN LOW DOSE 81 MG EC tablet Take 81 mg  by mouth daily.  4  . lisinopril (ZESTRIL) 5 MG tablet TAKE 1 TABLET BY MOUTH EVERY DAY 30 tablet 0  . metoprolol succinate (TOPROL-XL) 50 MG 24 hr tablet TAKE 1 TABLET BY MOUTH EVERY DAY 30 tablet 0   No current facility-administered medications for this visit.    Allergies:   Penicillins    Social History:  The patient  reports that he has never smoked. He has never used smokeless tobacco. He reports that he does not drink alcohol and does not use drugs.   Family History:  The patient's family history includes Cancer in his paternal grandfather; Diabetes in his maternal grandmother; Heart disease in his father and paternal grandfather; Heart disease (age of onset: 68) in his maternal grandfather; Heart failure (age of onset: 26) in his father; Hodgkin's lymphoma in his brother; Hypertension in his mother.    ROS:  Please see the history of present illness.   Otherwise, review of systems are positive for none.   All other systems are reviewed and negative.    PHYSICAL EXAM: VS:  BP 117/73   Pulse (!) 58   Temp (!) 97 F (36.1 C)   Ht 5\' 11"  (1.803 m)   Wt 205 lb 12.8 oz (93.4 kg)   SpO2 97%   BMI 28.70 kg/m  , BMI Body mass index is 28.7 kg/m. GENERAL:  Well appearing WM in NAD HEENT:  PERRL, EOMI, sclera are clear. Oropharynx is clear. NECK:  No jugular venous distention, carotid upstroke brisk and symmetric, no bruits, no thyromegaly or adenopathy LUNGS:  Clear to auscultation bilaterally CHEST:  Unremarkable HEART:  RRR,  PMI not displaced or sustained,S1 and S2 within normal limits, no S3, no S4: no clicks, no rubs, no murmurs ABD:  Soft, nontender. BS +, no masses or bruits. No hepatomegaly, no splenomegaly EXT:  2 + pulses throughout, no edema, no cyanosis no clubbing SKIN:  Warm and dry.  No rashes NEURO:  Alert and oriented x 3. Cranial nerves II through XII intact. PSYCH:  Cognitively intact     EKG:  EKG is ordered today. The ekg ordered today demonstrates NSR  rate 58. Normal Ecg. I have personally reviewed and interpreted this study.    Recent Labs: No results found for requested labs within last 8760 hours.    Lipid Panel    Component Value Date/Time   CHOL 95 (L) 07/01/2017 1107   TRIG 98 07/01/2017 1107   HDL 31 (L) 07/01/2017 1107   CHOLHDL 4.4 11/10/2012 0743   VLDL 26 11/10/2012 0743   LDLCALC 44 07/01/2017 1107   Labs dated 04/09/17: WBC 11.4, otherwise CBC normal. CMET normal. TSH normal. A1c 5.5%. Cholesterol 182, triglycerides 136, HDL 35, LDL 120.    Wt Readings from Last 3 Encounters:  12/28/19 205 lb 12.8 oz (93.4 kg)  07/20/18 188 lb (85.3 kg)  07/01/17 193 lb 6.4 oz (87.7 kg)      Other studies Reviewed: Additional studies/ records that were reviewed today include: review of hospital records from [facility] as noted above.    ASSESSMENT AND PLAN:  1.  CAD s/p acute lateral STEMI in October 2018 treated with emergent stenting of the ramus intermediate vessel with DES. He is asymptomatic.  Continue beta blocker, ACEi, and statin.  2. Hypercholesterolemia. On high dose statin. He is due for follow up lab work. Will arrange  3. Remote history of PE. Provoked by extended travel. No recurrence.    Current medicines are reviewed at length with the patient today.  The patient does not have concerns regarding medicines.  The following changes have been made:  no change  Labs/ tests ordered today include:   No orders of the defined types were placed in this encounter.    Disposition:   FU with me in one year  Signed, Acasia Skilton Martinique, MD  12/28/2019 5:01 PM    [facility] [address], [place], [place], [address] Phone [phone], Fax [phone]

## 2019-12-28 ENCOUNTER — Ambulatory Visit: Payer: 59 | Admitting: Cardiology

## 2019-12-28 ENCOUNTER — Other Ambulatory Visit: Payer: Self-pay

## 2019-12-28 ENCOUNTER — Encounter: Payer: Self-pay | Admitting: Cardiology

## 2019-12-28 VITALS — BP 117/73 | HR 58 | Temp 97.0°F | Ht 71.0 in | Wt 205.8 lb

## 2019-12-28 DIAGNOSIS — I2510 Atherosclerotic heart disease of native coronary artery without angina pectoris: Secondary | ICD-10-CM | POA: Diagnosis not present

## 2019-12-28 DIAGNOSIS — Z86711 Personal history of pulmonary embolism: Secondary | ICD-10-CM

## 2019-12-28 DIAGNOSIS — E7800 Pure hypercholesterolemia, unspecified: Secondary | ICD-10-CM

## 2020-01-05 LAB — BASIC METABOLIC PANEL
BUN/Creatinine Ratio: 7 — ABNORMAL LOW (ref 9–20)
BUN: 8 mg/dL (ref 6–24)
CO2: 22 mmol/L (ref 20–29)
Calcium: 9.7 mg/dL (ref 8.7–10.2)
Chloride: 102 mmol/L (ref 96–106)
Creatinine, Ser: 1.16 mg/dL (ref 0.76–1.27)
GFR calc Af Amer: 80 mL/min/{1.73_m2} (ref >59)
GFR calc non Af Amer: 69 mL/min/{1.73_m2} (ref >59)
Glucose: 87 mg/dL (ref 65–99)
Potassium: 4.8 mmol/L (ref 3.5–5.2)
Sodium: 139 mmol/L (ref 134–144)

## 2020-01-05 LAB — LIPID PANEL
Chol/HDL Ratio: 3.3 ratio (ref 0.0–5.0)
Cholesterol, Total: 116 mg/dL (ref 100–199)
HDL: 35 mg/dL — ABNORMAL LOW (ref >39)
LDL Chol Calc (NIH): 63 mg/dL (ref 0–99)
Triglycerides: 92 mg/dL (ref 0–149)
VLDL Cholesterol Cal: 18 mg/dL (ref 5–40)

## 2020-01-05 LAB — HEPATIC FUNCTION PANEL
ALT: 44 IU/L (ref 0–44)
AST: 26 IU/L (ref 0–40)
Albumin: 4.3 g/dL (ref 3.8–4.9)
Alkaline Phosphatase: 100 IU/L (ref 48–121)
Bilirubin Total: 0.9 mg/dL (ref 0.0–1.2)
Bilirubin, Direct: 0.21 mg/dL (ref 0.00–0.40)
Total Protein: 6.7 g/dL (ref 6.0–8.5)

## 2020-01-14 ENCOUNTER — Other Ambulatory Visit: Payer: Self-pay | Admitting: Cardiology

## 2021-02-06 ENCOUNTER — Other Ambulatory Visit: Payer: Self-pay | Admitting: Cardiology

## 2021-03-01 NOTE — Progress Notes (Signed)
Cardiology Office Note:    Date:  03/02/2021   ID:  [name], DOB 01/15/1961, MRN [id number]  PCP:  Lois Huxley, [place] Cardiologist: Peter Martinique, MD   Reason for visit: 1 year follow-up  History of Present Illness:    Andrew Gilbert is a 60 y.o. male with a hx of pulmonary embolism status post prolonged overseas travel without recurrence, STEMI in 2018 s/p DES to the ramus, EF 45-50%, hyperlipidemia.  He last saw Dr. Martinique in July 2021 and was doing well.  Today, patient is doing well from a cardiovascular standpoint.  He has had no chest pain, shortness of breath, syncope, bleeding issues, lower extremity edema, PND and orthopnea.  He mentions some lightheadedness when he gets up quickly after staring at the computer screen.  He mentions he has been at this new job for 1 year.  He complains of long hours working 12 to 14 hours/day.  He is looking into other possible work options.  He is trying to focus on his health.  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  He had a biometric screening at work and was noted to have HDL of 38.  He is trying to increase his intake of good fats, fish and nuts to help his lipid profile.  He mentions fatigue at the end of the day.   Past Medical History:  Diagnosis Date   Allergy    CAD (coronary artery disease)    Hypercholesterolemia    Phlebitis    Pulmonary embolism (H[facility])    STEMI involving oth coronary artery of anterior wall ([place]) 03/2017    No past surgical history on file.  Current Medications: Current Meds  Medication Sig   atorvastatin (LIPITOR) 80 MG tablet TAKE 1 TABLET BY MOUTH EVERY DAY   CVS ASPIRIN LOW DOSE 81 MG EC tablet Take 81 mg by mouth daily.   lisinopril (ZESTRIL) 5 MG tablet TAKE 1 TABLET BY MOUTH EVERY DAY   metoprolol succinate (TOPROL XL) 25 MG 24 hr tablet Take 1 tablet (25 mg total) by mouth daily.   [DISCONTINUED] metoprolol succinate (TOPROL-XL) 50 MG 24 hr tablet TAKE 1  TABLET BY MOUTH EVERY DAY     Allergies:   Penicillins   Social History   Socioeconomic History   Marital status: Divorced    Spouse name: Not on file   Number of children: Not on file   Years of education: Not on file   Highest education level: Not on file  Occupational History   Not on file  Tobacco Use   Smoking status: Never   Smokeless tobacco: Never  Vaping Use   Vaping Use: Never used  Substance and Sexual Activity   Alcohol use: No   Drug use: No   Sexual activity: Yes  Other Topics Concern   Not on file  Social History Narrative   Not on file   Social Determinants of Health   Financial Resource Strain: Not on file  Food Insecurity: Not on file  Transportation Needs: Not on file  Physical Activity: Not on file  Stress: Not on file  Social Connections: Not on file     Family History: The patient's family history includes Cancer in his paternal grandfather; Diabetes in his maternal grandmother; Heart disease in his father and paternal grandfather; Heart disease (age of onset: 7) in his maternal grandfather; Heart failure (age of onset: 22) in his father; Hodgkin's lymphoma in his  brother; Hypertension in his mother.  ROS:   Please see the history of present illness.     EKGs/Labs/Other Studies Reviewed:    EKG:  The ekg ordered today demonstrates sinus bradycardia, heart rate 57, QRS duration 94 ms.  Recent Labs: No results found for requested labs within last 8760 hours.  Recent Lipid Panel    Component Value Date/Time   CHOL 116 01/05/2020 0831   TRIG 92 01/05/2020 0831   HDL 35 (L) 01/05/2020 0831   CHOLHDL 3.3 01/05/2020 0831   CHOLHDL 4.4 11/10/2012 0743   VLDL 26 11/10/2012 0743   LDLCALC 63 01/05/2020 0831    Physical Exam:    VS:  BP 120/70 (BP Location: Left Arm)   Pulse (!) 57   Ht '5\' 11"'$  (1.803 m)   Wt 203 lb (92.1 kg)   SpO2 98%   BMI 28.31 kg/m     Wt Readings from Last 3 Encounters:  03/02/21 203 lb (92.1 kg)  12/28/19  205 lb 12.8 oz (93.4 kg)  07/20/18 188 lb (85.3 kg)     GEN:  Well nourished, well developed in no acute distress HEENT: Normal NECK: No JVD; No carotid bruits CARDIAC: RRR, no murmurs, rubs, gallops RESPIRATORY:  Clear to auscultation without rales, wheezing or rhonchi  ABDOMEN: Soft, non-tender, non-distended MUSCULOSKELETAL: No edema; No deformity  SKIN: Warm and dry NEUROLOGIC:  Alert and oriented PSYCHIATRIC:  Normal affect   ASSESSMENT AND PLAN   CAD, stable - s/p lateral STEMI in 2018 with DES to Ramus  - No angina - Continue aspirin, statin and beta-blocker therapy.  With mild bradycardia and fatigue, will reduce Toprol to 25 mg daily.  Ischemic cardiomyopathy, euvolemic - EF 45 to 50% post MI - Debated over ordering echo to see if EF has improved with revascularization and medical therapy.  Though, this would not change current management. - Continue ACE and beta-blocker therapy. - Goal BP is <130/80.  Recommend DASH diet (high in vegetables, fruits, low-fat dairy products, whole grains, poultry, fish, and nuts and low in sweets, sugar-sweetened beverages, and red meats), salt restriction and increase physical activity.  Hyperlipidemia - LDL 51 on his biometric screening in July 2022.  HDL 38 and triglycerides 65. - Continue Lipitor 80 mg daily. - Discussed cholesterol lowering diets - Mediterranean diet, DASH diet, vegetarian diet, low-carbohydrate diet and avoidance of trans fats.  Discussed healthier choice substitutes.  Nuts, high-fiber foods, and fiber supplements may also improve lipids.    Disposition - Follow-up in 1 year with Dr. Martinique or myself.         Medication Adjustments/Labs and Tests Ordered: Current medicines are reviewed at length with the patient today.  Concerns regarding medicines are outlined above.  Orders Placed This Encounter  Procedures   EKG 12-Lead   Meds ordered this encounter  Medications   metoprolol succinate (TOPROL XL) 25 MG  24 hr tablet    Sig: Take 1 tablet (25 mg total) by mouth daily.    Dispense:  90 tablet    Refill:  3    Patient Instructions  Medication Instructions:  Decrease Metoprolol ( 25 mg 1 Tablet Daily.) *If you need a refill on your cardiac medications before your next appointment, please call your pharmacy*   Lab Work: No Labs If you have labs (blood work) drawn today and your tests are completely normal, you will receive your results only by: [place] (if you have MyChart) OR A paper copy in the mail  If you have any lab test that is abnormal or we need to change your treatment, we will call you to review the results.   Testing/Procedures: No Testing   Follow-Up: At [facility], you and your health needs are our priority.  As part of our continuing mission to provide you with exceptional heart care, we have created designated Provider Care Teams.  These Care Teams include your primary Cardiologist (physician) and Advanced Practice Providers (APPs -  Physician Assistants and Nurse Practitioners) who all work together to provide you with the care you need, when you need it.  Your next appointment:   1 year(s)  The format for your next appointment:   In Person  Provider:   Peter Martinique, MD       Signed, Warren Lacy, PA-C  03/02/2021 9:41 AM    [place]

## 2021-03-02 ENCOUNTER — Other Ambulatory Visit: Payer: Self-pay

## 2021-03-02 ENCOUNTER — Ambulatory Visit: Payer: BC Managed Care – PPO | Admitting: Physician Assistant

## 2021-03-02 ENCOUNTER — Encounter: Payer: Self-pay | Admitting: Physician Assistant

## 2021-03-02 VITALS — BP 120/70 | HR 57 | Ht 71.0 in | Wt 203.0 lb

## 2021-03-02 DIAGNOSIS — E7800 Pure hypercholesterolemia, unspecified: Secondary | ICD-10-CM

## 2021-03-02 DIAGNOSIS — I255 Ischemic cardiomyopathy: Secondary | ICD-10-CM

## 2021-03-02 DIAGNOSIS — I2510 Atherosclerotic heart disease of native coronary artery without angina pectoris: Secondary | ICD-10-CM

## 2021-03-02 MED ORDER — METOPROLOL SUCCINATE ER 25 MG PO TB24: 25.0000 mg | ORAL_TABLET | Freq: Every day | ORAL | 3 refills | Status: DC

## 2021-03-02 NOTE — Patient Instructions (Signed)
Medication Instructions:  Decrease Metoprolol ( 25 mg 1 Tablet Daily.) *If you need a refill on your cardiac medications before your next appointment, please call your pharmacy*   Lab Work: No Labs If you have labs (blood work) drawn today and your tests are completely normal, you will receive your results only by: [place] (if you have MyChart) OR A paper copy in the mail If you have any lab test that is abnormal or we need to change your treatment, we will call you to review the results.   Testing/Procedures: No Testing   Follow-Up: At [facility], you and your health needs are our priority.  As part of our continuing mission to provide you with exceptional heart care, we have created designated Provider Care Teams.  These Care Teams include your primary Cardiologist (physician) and Advanced Practice Providers (APPs -  Physician Assistants and Nurse Practitioners) who all work together to provide you with the care you need, when you need it.  Your next appointment:   1 year(s)  The format for your next appointment:   In Person  Provider:   Peter Martinique, MD

## 2021-03-08 ENCOUNTER — Encounter: Payer: Self-pay | Admitting: Physician Assistant

## 2022-02-27 ENCOUNTER — Other Ambulatory Visit: Payer: Self-pay | Admitting: Cardiology

## 2022-02-27 MED ORDER — METOPROLOL SUCCINATE ER 25 MG PO TB24: 25.0000 mg | ORAL_TABLET | Freq: Every day | ORAL | 1 refills | Status: DC

## 2022-02-28 NOTE — Progress Notes (Unsigned)
Cardiology Office Note   Date:  03/04/2022   ID:  Andrew Gilbert, DOB May 14, 1961, MRN [id number]  PCP:  Lois Huxley, PA  Cardiologist:   Betsey Sossamon Martinique, MD   Chief Complaint  Patient presents with   Coronary Artery Disease      History of Present Illness: Andrew Gilbert is a 61 y.o. male who is seen for follow up  of CAD. He was seen remotely in 2014 by Dr. Sallyanne Kuster for treatment of pulmonary embolism. This was felt to be triggered by prolonged overseas travel. No recurrence. He was admitted 04/06/17 to [facility] with acute lateral STEMI. He reports he had been doing a lot of yard work for his mother who lives in [place], [place]. Afterwards he was driving to lunch when he developed acute chest pain that felt like bad indigestion. He drove to ED in [facility] and was transferred by helicopter to [place]. He had emergent stenting of the ramus intermediate branch with a Xience Sierra DES. EDP was 28 mm Hg. Echo showed EF 45-50%. Anterolateral HK. Peak troponin 117.9. Chemistries, CBC, TSH, A1c normal. Cholesterol 182 with LDL 120. HDL 35. Triglycerides 136. He was DC on Plavix, ASA, metoprolol, lisinopril and lipitor. He is no longer on Xarelto.   Was seen by Caron Presume PA-C last September. Beta blocker dose was reduced due to fatigue and bradycardia. He notes symptoms of lightheadedness and fatigue have improved.   On follow up he is doing very well. Denies any chest pain, palpitations, dyspnea. No edema. 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 Is getting ready to change jobs which will help with his long commute each day and hopefully help allow more time for exercise.     Past Medical History:  Diagnosis Date   Allergy    CAD (coronary artery disease)    Hypercholesterolemia    Phlebitis    Pulmonary embolism (HCC)    STEMI involving oth coronary artery of anterior wall ([address]) 03/2017    History reviewed. No pertinent surgical history.   Current  Outpatient Medications  Medication Sig Dispense Refill   [facility] ASPIRIN LOW DOSE 81 MG EC tablet Take 81 mg by mouth daily.  4   lisinopril (ZESTRIL) 5 MG tablet Take 1 tablet (5 mg total) by mouth daily. SCHEDULE OFFICE VISIT FOR FUTURE REFILLS. 90 tablet 3   atorvastatin (LIPITOR) 80 MG tablet Take 1 tablet (80 mg total) by mouth daily. SCHEDULE OFFICE VISIT FOR FUTURE REFILLS. 90 tablet 3   metoprolol succinate (TOPROL XL) 25 MG 24 hr tablet Take 1 tablet (25 mg total) by mouth daily. SCHEDULE OFFICE VISIT FOR FUTURE REFILLS. 90 tablet 3   No current facility-administered medications for this visit.    Allergies:   Penicillins    Social History:  The patient  reports that he has never smoked. He has never used smokeless tobacco. He reports that he does not drink alcohol and does not use drugs.   Family History:  The patient's family history includes Cancer in his paternal grandfather; Diabetes in his maternal grandmother; Heart disease in his father and paternal grandfather; Heart disease (age of onset: 32) in his maternal grandfather; Heart failure (age of onset: 31) in his father; Hodgkin's lymphoma in his brother; Hypertension in his mother.    ROS:  Please see the history of present illness.   Otherwise, review of systems are positive for none.   All other systems are reviewed and negative.  PHYSICAL EXAM: VS:  BP 110/70   Pulse 63   Ht '5\' 11"'$  (1.803 m)   Wt 202 lb (91.6 kg)   SpO2 98%   BMI 28.17 kg/m  , BMI Body mass index is 28.17 kg/m. GENERAL:  Well appearing WM in NAD HEENT:  PERRL, EOMI, sclera are clear. Oropharynx is clear. NECK:  No jugular venous distention, carotid upstroke brisk and symmetric, no bruits, no thyromegaly or adenopathy LUNGS:  Clear to auscultation bilaterally CHEST:  Unremarkable HEART:  RRR,  PMI not displaced or sustained,S1 and S2 within normal limits, no S3, no S4: no clicks, no rubs, no murmurs ABD:  Soft, nontender. BS +, no masses or  bruits. No hepatomegaly, no splenomegaly EXT:  2 + pulses throughout, no edema, no cyanosis no clubbing SKIN:  Warm and dry.  No rashes NEURO:  Alert and oriented x 3. Cranial nerves II through XII intact. PSYCH:  Cognitively intact   EKG:  EKG is ordered today. The ekg ordered today demonstrates NSR rate 62. Normal Ecg. I have personally reviewed and interpreted this study.    Recent Labs: No results found for requested labs within last 365 days.    Lipid Panel    Component Value Date/Time   CHOL 116 01/05/2020 0831   TRIG 92 01/05/2020 0831   HDL 35 (L) 01/05/2020 0831   CHOLHDL 3.3 01/05/2020 0831   CHOLHDL 4.4 11/10/2012 0743   VLDL 26 11/10/2012 0743   LDLCALC 63 01/05/2020 0831   Labs dated 04/09/17: WBC 11.4, otherwise CBC normal. CMET normal. TSH normal. A1c 5.5%. Cholesterol 182, triglycerides 136, HDL 35, LDL 120. Dated 12/27/21: HDL 36. LDL 65. Triglycerides 75. Cholesterol 116. CMET normal.     Wt Readings from Last 3 Encounters:  03/04/22 202 lb (91.6 kg)  03/02/21 203 lb (92.1 kg)  12/28/19 205 lb 12.8 oz (93.4 kg)      Other studies Reviewed: Additional studies/ records that were reviewed today include: review of hospital records from [facility] as noted above.    ASSESSMENT AND PLAN:  1.  CAD s/p acute lateral STEMI in October 2018 treated with emergent stenting of the ramus intermediate vessel with DES. He is asymptomatic.  Continue beta blocker, ACEi, and statin. On ASA 81 mg daily.   2. Hypercholesterolemia. On high dose statin. LDL is at goal   3. Remote history of PE. Provoked by extended travel. No recurrence.    Current medicines are reviewed at length with the patient today.  The patient does not have concerns regarding medicines.  The following changes have been made:  no change  Labs/ tests ordered today include:   Orders Placed This Encounter  Procedures   EKG 12-Lead     Disposition:   FU with me in one year  Signed, Aleene Swanner  Martinique, MD  03/04/2022 8:18 AM    [facility] [address] [place], [place], [address] Phone [phone], Fax [phone]

## 2022-03-04 ENCOUNTER — Ambulatory Visit: Payer: BC Managed Care – PPO | Attending: Cardiology | Admitting: Cardiology

## 2022-03-04 ENCOUNTER — Encounter: Payer: Self-pay | Admitting: Cardiology

## 2022-03-04 VITALS — BP 110/70 | HR 63 | Ht 71.0 in | Wt 202.0 lb

## 2022-03-04 DIAGNOSIS — E7800 Pure hypercholesterolemia, unspecified: Secondary | ICD-10-CM | POA: Diagnosis not present

## 2022-03-04 DIAGNOSIS — I2510 Atherosclerotic heart disease of native coronary artery without angina pectoris: Secondary | ICD-10-CM

## 2022-03-04 MED ORDER — ATORVASTATIN CALCIUM 80 MG PO TABS: 80.0000 mg | ORAL_TABLET | Freq: Every day | ORAL | 3 refills | Status: DC

## 2022-03-04 MED ORDER — METOPROLOL SUCCINATE ER 25 MG PO TB24: 25.0000 mg | ORAL_TABLET | Freq: Every day | ORAL | 3 refills | Status: DC

## 2022-12-13 ENCOUNTER — Ambulatory Visit: Payer: Self-pay | Admitting: Family

## 2022-12-29 ENCOUNTER — Other Ambulatory Visit: Payer: Self-pay | Admitting: Cardiology

## 2022-12-30 ENCOUNTER — Encounter: Payer: Self-pay | Admitting: Family

## 2022-12-30 ENCOUNTER — Ambulatory Visit: Payer: BC Managed Care – PPO | Admitting: Family

## 2022-12-30 VITALS — BP 116/62 | HR 64 | Temp 98.6°F | Ht 71.0 in | Wt 203.1 lb

## 2022-12-30 DIAGNOSIS — R140 Abdominal distension (gaseous): Secondary | ICD-10-CM

## 2022-12-30 DIAGNOSIS — I2510 Atherosclerotic heart disease of native coronary artery without angina pectoris: Secondary | ICD-10-CM | POA: Insufficient documentation

## 2022-12-30 DIAGNOSIS — Z1211 Encounter for screening for malignant neoplasm of colon: Secondary | ICD-10-CM

## 2022-12-30 NOTE — Progress Notes (Signed)
   New Patient Office Visit  Subjective:  Patient ID: Andrew Gilbert, male    DOB: 1961/02/27  Age: 62 y.o. MRN: [id number]  CC:  Chief Complaint  Patient presents with   New Patient (Initial Visit)   Bloated    sx for about 2 weeks.    HPI [name] presents for establishing care today.  Bloating/Constipation:  Pt c/o bloating. Also moderate constipation, felt a lot of pressure in his lower back and felt better after having a BM.  Has tried liquid Senakot which does help sx. Sx have been for the last couple of weeks. Pt tried changing his diet which did not help. Feels a tight, full sensation in abdomen. Denies epigastric pain, heartburn, or nausea. Denies any new supplements or herbal meds.    Assessment & Plan:  Abdominal bloating- advised to try OTC generic gas-X as directed on box, and Beano, OTC before meals. Advised on avoiding FODMOD foods and high gluten, handouts provided. Drink at least 2L water daily, eat high fiber diet, can add Benefiber powder OTC daily and/or generic stool softener every day to ensure daily soft BM.  Screening for colon cancer- pt reports never having colon ca screening done.  -     Ambulatory referral to Gastroenterology   Subjective:    Outpatient Medications Prior to Visit  Medication Sig Dispense Refill   atorvastatin (LIPITOR) 80 MG tablet Take 1 tablet (80 mg total) by mouth daily. SCHEDULE OFFICE VISIT FOR FUTURE REFILLS. 90 tablet 3   [facility]S ASPIRIN LOW DOSE 81 MG EC tablet Take 81 mg by mouth daily.  4   lisinopril (ZESTRIL) 5 MG tablet Take 1 tablet (5 mg total) by mouth daily. SCHEDULE OFFICE VISIT FOR FUTURE REFILLS. 90 tablet 3   metoprolol succinate (TOPROL XL) 25 MG 24 hr tablet Take 1 tablet (25 mg total) by mouth daily. SCHEDULE OFFICE VISIT FOR FUTURE REFILLS. 90 tablet 3   No facility-administered medications prior to visit.   Past Medical History:  Diagnosis Date   Allergy    CAD (coronary artery disease)     Hypercholesterolemia    Phlebitis    Pulmonary embolism (HCC)    STEMI involving oth coronary artery of anterior wall (HCC) 03/2017   No past surgical history on file.  Objective:   Today's Vitals: BP 116/62   Pulse 64   Temp 98.6 F (37 C) (Temporal)   Ht 5\' 11"  (1.803 m)   Wt 203 lb 2 oz (92.1 kg)   SpO2 97%   BMI 28.33 kg/m   Physical Exam Vitals and nursing note reviewed.  Constitutional:      General: He is not in acute distress.    Appearance: Normal appearance.  HENT:     Head: Normocephalic.  Cardiovascular:     Rate and Rhythm: Normal rate and regular rhythm.  Pulmonary:     Effort: Pulmonary effort is normal.     Breath sounds: Normal breath sounds.  Musculoskeletal:        General: Normal range of motion.     Cervical back: Normal range of motion.  Skin:    General: Skin is warm and dry.  Neurological:     Mental Status: He is alert and oriented to person, place, and time.  Psychiatric:        Mood and Affect: Mood normal.    Dulce Sellar, NP

## 2022-12-30 NOTE — Patient Instructions (Signed)
Welcome to [facility], It was a pleasure meeting you today!    As discussed, I would try to avoid gas-producing foods as much as possible.  You also can try to avoid gluten in foods & see if this lessens your bloating. Drink at least 2 liters = 8 cups of water every day, you can try over the counter Benefiber or Citrucel powders for extra fiber. See the handouts attached for more info.  I have sent a referral to our gastroenterology office.      PLEASE NOTE: If you had any LAB tests please let us know if you have not heard back within a few days. You may see your results on MyChart before we have a chance to review them but we will give you a call once they are reviewed by [place]. If we ordered any REFERRALS today, please let us know if you have not heard from their office within the next week.  Let us know through MyChart if you are needing REFILLS, or have your pharmacy send [place] the request. You can also use MyChart to communicate with me or any office staff.  Please try these tips to maintain a healthy lifestyle: It is important that you exercise regularly at least 30 minutes 5 times a week. Think about what you will eat, plan ahead. Choose whole foods, & think  "clean, green, fresh or frozen" over canned, processed or packaged foods which are more sugary, salty, and fatty. 70 to 75% of food eaten should be fresh vegetables and protein. 2-3  meals daily with healthy snacks between meals, but must be whole fruit, protein or vegetables. Aim to eat over a 10 hour period when you are active, for example, 7am to 5pm, and then STOP after your last meal of the day, drinking only water.  Shorter eating windows, 6-8 hours, are showing benefits in heart disease and blood sugar regulation. Drink water every day! Shoot for 64 ounces daily = 8 cups, no other drink is as healthy! Fruit juice is best enjoyed in a healthy way, by EATING the fruit.

## 2023-02-27 NOTE — Progress Notes (Signed)
Cardiology Clinic Note   Date: 02/28/2023 ID: Edythe Clarity, DOB 1961/05/17, MRN [id number]  Primary Cardiologist:  Peter Swaziland, MD  Patient Profile    Andrew Gilbert is a 62 y.o. male who presents to the clinic today for routine follow up.     Past medical history significant for: CAD. LHC 04/06/2017 (lateral STEMI) performed at [facility] in [place], [place]: PCI with DES to ramus. Hyperlipidemia. PE/DVT.     History of Present Illness    Andrew Gilbert is followed by Dr. Swaziland for the above outlined history.  In summary, patient was seen remotely in 2014 by Dr. Royann Shivers for treatment of PE felt to be triggered by prolonged overseas travel.  No recurrence.  On 04/06/2017 patient was admitted to [facility] with acute lateral STEMI.  He reported doing a lot of yard work earlier in the day and while driving to lunch developed acute chest pain that felt like bad indigestion.  He was transferred by helicopter to [facility] where he underwent emergent PCI with DES to ramus.  Echo showed EF 45 to 50%, anterior lateral hypokinesis.  He was discharged on Plavix, aspirin, metoprolol, lisinopril and Lipitor.  At his first visit with Dr. Swaziland on 07/01/2017 he was doing well.  On follow-up in September 2022 patient complained of fatigue and bradycardia and his beta-blocker was decreased with improvement of symptoms.  Patient was last seen in the office by Dr. Swaziland on 03/04/2022 for routine follow-up.  He was doing well at that time and no changes were made.  Today, patient is doing well. He recently changed companies for his job about 3 weeks ago. 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 Patient denies shortness of breath, dyspnea on exertion, lower extremity edema, orthopnea or PND. No chest pain, pressure, or tightness. No palpitations.  He has cut out fast food in the last year and tends to follow a gluten free diet, as he felt bloated eating gluten. He stays  active walking during his lunch hour and doing push-ups/light weight training every other day at home. He also has an elliptical at home which he uses infrequently.     ROS: All other systems reviewed and are otherwise negative except as noted in History of Present Illness.  Studies Reviewed    EKG Interpretation Date/Time:  Friday February 28 2023 10:42:32 EDT Ventricular Rate:  57 PR Interval:  148 QRS Duration:  82 QT Interval:  408 QTC Calculation: 397 R Axis:   69  Text Interpretation: Sinus bradycardia No significant change from  03/04/2022 (Not in Muse) Confirmed by Carlos Levering [phone]) on 02/28/2023 10:50:50 AM       Physical Exam    VS:  BP 110/60   Pulse (!) 57   Ht 5\' 11"  (1.803 m)   Wt 200 lb 12.8 oz (91.1 kg)   SpO2 95%   BMI 28.01 kg/m  , BMI Body mass index is 28.01 kg/m.  GEN: Well nourished, well developed, in no acute distress. Neck: No JVD or carotid bruits. Cardiac:  RRR. No murmurs. No rubs or gallops.   Respiratory:  Respirations regular and unlabored. Clear to auscultation without rales, wheezing or rhonchi. GI: Soft, nontender, nondistended. Extremities: Radials/DP/PT 2+ and equal bilaterally. No clubbing or cyanosis. No edema.  Skin: Warm and dry, no rash. Neuro: Strength intact.  Assessment & Plan    CAD.  S/p PCI with DES to ramus October 2018 performed in [place].  Patient denies chest pain, tightness, pressure. He is active walking during his lunch hour and doing pushups/weight training at home every other day. He is working on improving his nutrition and cut out fast food over the last year. Continue aspirin, atorvastatin, Toprol. Will get CBC and BMP today.  Bradycardia. Heart rate 57 bpm. He denies lightheadedness, dizziness, orthostasis. He does spot check his heart rate at home. He is instructed to contact the office if lower heart rate becomes bothersome. We can consider reducing dose of metoprolol to 12.5 mg if it becomes an issue  for him.  Hyperlipidemia. Patient believes his cholesterol was checked about 1 year ago for an employee wellness visit. He did eat breakfast this morning. Will get lipid panel with direct LDL today.  History of PE/DVT.  Remote history 2014 provoked by prolonged overseas travel.  No recurrence.  Disposition: CBC, BMP, lipid panel, direct LDL today. Return in 1 year or sooner as needed.          Signed, Etta Grandchild. Nainoa Woldt, DNP, NP-C

## 2023-02-28 ENCOUNTER — Encounter: Payer: Self-pay | Admitting: Student

## 2023-02-28 ENCOUNTER — Ambulatory Visit: Payer: BC Managed Care – PPO | Attending: Student | Admitting: Student

## 2023-02-28 VITALS — BP 110/60 | HR 57 | Ht 71.0 in | Wt 200.8 lb

## 2023-02-28 DIAGNOSIS — E785 Hyperlipidemia, unspecified: Secondary | ICD-10-CM | POA: Diagnosis not present

## 2023-02-28 DIAGNOSIS — I2510 Atherosclerotic heart disease of native coronary artery without angina pectoris: Secondary | ICD-10-CM

## 2023-02-28 DIAGNOSIS — R001 Bradycardia, unspecified: Secondary | ICD-10-CM

## 2023-02-28 DIAGNOSIS — Z79899 Other long term (current) drug therapy: Secondary | ICD-10-CM

## 2023-02-28 DIAGNOSIS — Z86711 Personal history of pulmonary embolism: Secondary | ICD-10-CM

## 2023-02-28 NOTE — Patient Instructions (Addendum)
Medication Instructions:  NO MEDICATION CHANGES *If you need a refill on your cardiac medications before your next appointment, please call your pharmacy*   Lab Work: CBC, BMP, LIPID PANEL, DIRECT LDL If you have labs (blood work) drawn today and your tests are completely normal, you will receive your results only by: MyChart Message (if you have MyChart) OR A paper copy in the mail If you have any lab test that is abnormal or we need to change your treatment, we will call you to review the results.   Testing/Procedures: NONE   Follow-Up: At [facility], you and your health needs are our priority.  As part of our continuing mission to provide you with exceptional heart care, we have created designated Provider Care Teams.  These Care Teams include your primary Cardiologist (physician) and Advanced Practice Providers (APPs -  Physician Assistants and Nurse Practitioners) who all work together to provide you with the care you need, when you need it.  We recommend signing up for the patient portal called "MyChart".  Sign up information is provided on this After Visit Summary.  MyChart is used to connect with patients for Virtual Visits (Telemedicine).  Patients are able to view lab/test results, encounter notes, upcoming appointments, etc.  Non-urgent messages can be sent to your provider as well.   To learn more about what you can do with MyChart, go to [email or web address].    Your next appointment:   1 year(s)  A letter will be mailed to you as a reminder to call the office for your follow up appointment.   Provider:   Peter Swaziland, MD

## 2023-03-01 LAB — LDL CHOLESTEROL, DIRECT: LDL Direct: 63 mg/dL (ref 0–99)

## 2023-03-01 LAB — CBC
Hematocrit: 49.2 % (ref 37.5–51.0)
Hemoglobin: 16.5 g/dL (ref 13.0–17.7)
MCH: 31.3 pg (ref 26.6–33.0)
MCHC: 33.5 g/dL (ref 31.5–35.7)
MCV: 93 fL (ref 79–97)
Platelets: 227 10*3/uL (ref 150–450)
RBC: 5.28 x10E6/uL (ref 4.14–5.80)
RDW: 11.7 % (ref 11.6–15.4)
WBC: 9.3 10*3/uL (ref 3.4–10.8)

## 2023-03-01 LAB — LIPID PANEL
Chol/HDL Ratio: 3.7 ratio (ref 0.0–5.0)
Cholesterol, Total: 118 mg/dL (ref 100–199)
HDL: 32 mg/dL — ABNORMAL LOW (ref >39)
LDL Chol Calc (NIH): 63 mg/dL (ref 0–99)
Triglycerides: 128 mg/dL (ref 0–149)
VLDL Cholesterol Cal: 23 mg/dL (ref 5–40)

## 2023-03-01 LAB — BASIC METABOLIC PANEL
BUN/Creatinine Ratio: 13 (ref 10–24)
BUN: 13 mg/dL (ref 8–27)
CO2: 26 mmol/L (ref 20–29)
Calcium: 9.5 mg/dL (ref 8.6–10.2)
Chloride: 102 mmol/L (ref 96–106)
Creatinine, Ser: 1.03 mg/dL (ref 0.76–1.27)
Glucose: 85 mg/dL (ref 70–99)
Potassium: 4.2 mmol/L (ref 3.5–5.2)
Sodium: 140 mmol/L (ref 134–144)
eGFR: 83 mL/min/{1.73_m2} (ref >59)

## 2023-03-31 ENCOUNTER — Other Ambulatory Visit: Payer: Self-pay | Admitting: Cardiology

## 2023-07-06 ENCOUNTER — Other Ambulatory Visit: Payer: Self-pay | Admitting: Cardiology

## 2023-07-12 DIAGNOSIS — R051 Acute cough: Secondary | ICD-10-CM | POA: Diagnosis not present

## 2023-07-12 DIAGNOSIS — J0100 Acute maxillary sinusitis, unspecified: Secondary | ICD-10-CM | POA: Diagnosis not present

## 2023-07-12 DIAGNOSIS — R0982 Postnasal drip: Secondary | ICD-10-CM | POA: Diagnosis not present

## 2023-07-12 DIAGNOSIS — R0981 Nasal congestion: Secondary | ICD-10-CM | POA: Diagnosis not present

## 2023-07-29 DIAGNOSIS — I10 Essential (primary) hypertension: Secondary | ICD-10-CM | POA: Diagnosis not present

## 2023-07-29 DIAGNOSIS — J0101 Acute recurrent maxillary sinusitis: Secondary | ICD-10-CM | POA: Diagnosis not present

## 2023-07-29 DIAGNOSIS — Z6827 Body mass index (BMI) 27.0-27.9, adult: Secondary | ICD-10-CM | POA: Diagnosis not present

## 2023-08-25 DIAGNOSIS — I789 Disease of capillaries, unspecified: Secondary | ICD-10-CM | POA: Diagnosis not present

## 2024-02-26 ENCOUNTER — Encounter: Payer: Self-pay | Admitting: Cardiology

## 2024-04-04 ENCOUNTER — Other Ambulatory Visit: Payer: Self-pay | Admitting: Cardiology

## 2024-04-05 ENCOUNTER — Other Ambulatory Visit: Payer: Self-pay | Admitting: Cardiology
